# Patient Record
Sex: Female | Born: 1945 | Race: White | Hispanic: No | Marital: Married | Smoking: Never smoker
Health system: Southern US, Community
[De-identification: ages and names within clinical notes are randomized; demographics above are authoritative.]

## PROBLEM LIST (undated history)

## (undated) DIAGNOSIS — M1990 Unspecified osteoarthritis, unspecified site: Secondary | ICD-10-CM

## (undated) DIAGNOSIS — I10 Essential (primary) hypertension: Secondary | ICD-10-CM

## (undated) DIAGNOSIS — C801 Malignant (primary) neoplasm, unspecified: Secondary | ICD-10-CM

## (undated) DIAGNOSIS — E079 Disorder of thyroid, unspecified: Secondary | ICD-10-CM

## (undated) HISTORY — PX: TONSILLECTOMY: SUR1361

## (undated) HISTORY — PX: BREAST SURGERY: SHX581

## (undated) HISTORY — PX: THYROID SURGERY: SHX805

## (undated) HISTORY — PX: ABDOMINAL HYSTERECTOMY: SHX81

## (undated) HISTORY — PX: OTHER SURGICAL HISTORY: SHX169

## (undated) HISTORY — PX: CATARACT EXTRACTION, BILATERAL: SHX1313

---

## 2016-04-02 NOTE — Progress Notes (Signed)
Pt is being scheduled for preop appt; please place surgical orders in epic. Thanks.  

## 2016-04-05 ENCOUNTER — Ambulatory Visit: Payer: Self-pay | Admitting: Orthopedic Surgery

## 2016-05-06 NOTE — Patient Instructions (Addendum)
Nichole Pierce  05/06/2016   Your procedure is scheduled on: Wednesday May 13, 2016  Report to [facility]  Entrance take [place]  elevators to 3rd floor to  [place] at 6:00 AM.  Call this number if you have problems the morning of surgery [phone]   Remember: ONLY 1 PERSON MAY GO WITH YOU TO SHORT STAY TO GET  READY MORNING OF [name].  Do not eat food or drink liquids :After Midnight.     Take these medicines the morning of surgery with A SIP OF WATER: LEVOTHYROXINE (SYNTHROID)                               You may not have any metal on your body including hair pins and              piercings  Do not wear jewelry, make-up, lotions, powders or perfumes, deodorant             Do not wear nail polish.  Do not shave  48 hours prior to surgery.              Do not bring valuables to the hospital. [name]  Contacts, dentures or bridgework may not be worn into surgery.  Leave suitcase in the car. After surgery it may be brought to your room.                  Please read over the following fact sheets you were given:MRSA INFORMATION SHEET; INCENTIVE SPIROMETER; BLOOD TRANSFUSION INFORMATION SHEET  _____________________________________________________________________             [facility] - Preparing for Surgery Before surgery, you can play an important role.  Because skin is not sterile, your skin needs to be as free of germs as possible.  You can reduce the number of germs on your skin by washing with CHG (chlorahexidine gluconate) soap before surgery.  CHG is an antiseptic cleaner which kills germs and bonds with the skin to continue killing germs even after washing. Please DO NOT use if you have an allergy to CHG or antibacterial soaps.  If your skin becomes reddened/irritated stop using the CHG and inform your nurse when you arrive at Short Stay. Do not shave (including legs and  underarms) for at least 48 hours prior to the first CHG shower.  You may shave your face/neck. Please follow these instructions carefully:  1.  Shower with CHG Soap the night before surgery and the  morning of Surgery.  2.  If you choose to wash your hair, wash your hair first as usual with your  normal  shampoo.  3.  After you shampoo, rinse your hair and body thoroughly to remove the  shampoo.                           4.  Use CHG as you would any other liquid soap.  You can apply chg directly  to the skin and wash                       Gently with a scrungie or clean washcloth.  5.  Apply  the CHG Soap to your body ONLY FROM THE NECK DOWN.   Do not use on face/ open                           Wound or open sores. Avoid contact with eyes, ears mouth and genitals (private parts).                       Wash face,  Genitals (private parts) with your normal soap.             6.  Wash thoroughly, paying special attention to the area where your surgery  will be performed.  7.  Thoroughly rinse your body with warm water from the neck down.  8.  DO NOT shower/wash with your normal soap after using and rinsing off  the CHG Soap.                9.  Pat yourself dry with a clean towel.            10.  Wear clean pajamas.            11.  Place clean sheets on your bed the night of your first shower and do not  sleep with pets. Day of Surgery : Do not apply any lotions/deodorants the morning of surgery.  Please wear clean clothes to the hospital/surgery center.  FAILURE TO FOLLOW THESE INSTRUCTIONS MAY RESULT IN THE CANCELLATION OF YOUR SURGERY PATIENT SIGNATURE_________________________________  NURSE SIGNATURE__________________________________  ________________________________________________________________________   [name]  An incentive spirometer is a tool that can help keep your lungs clear and active. This tool measures how well you are filling your lungs with each breath. Taking  long deep breaths may help reverse or decrease the chance of developing breathing (pulmonary) problems (especially infection) following:  A long period of time when you are unable to move or be active. BEFORE THE PROCEDURE   If the spirometer includes an indicator to show your best effort, your nurse or respiratory therapist will set it to a desired goal.  If possible, sit up straight or lean slightly forward. Try not to slouch.  Hold the incentive spirometer in an upright position. INSTRUCTIONS FOR USE  1. Sit on the edge of your bed if possible, or sit up as far as you can in bed or on a chair. 2. Hold the incentive spirometer in an upright position. 3. Breathe out normally. 4. Place the mouthpiece in your mouth and seal your lips tightly around it. 5. Breathe in slowly and as deeply as possible, raising the piston or the ball toward the top of the column. 6. Hold your breath for 3-5 seconds or for as long as possible. Allow the piston or ball to fall to the bottom of the column. 7. Remove the mouthpiece from your mouth and breathe out normally. 8. Rest for a few seconds and repeat Steps 1 through 7 at least 10 times every 1-2 hours when you are awake. Take your time and take a few normal breaths between deep breaths. 9. The spirometer may include an indicator to show your best effort. Use the indicator as a goal to work toward during each repetition. 10. After each set of 10 deep breaths, practice coughing to be sure your lungs are clear. If you have an incision (the cut made at the time of surgery), support your incision when coughing by placing a pillow or rolled  up towels firmly against it. Once you are able to get out of bed, walk around indoors and cough well. You may stop using the incentive spirometer when instructed by your caregiver.  RISKS AND COMPLICATIONS  Take your time so you do not get dizzy or light-headed.  If you are in pain, you may need to take or ask for pain  medication before doing incentive spirometry. It is harder to take a deep breath if you are having pain. AFTER USE  Rest and breathe slowly and easily.  It can be helpful to keep track of a log of your progress. Your caregiver can provide you with a simple table to help with this. If you are using the spirometer at home, follow these instructions: [place] IF:   You are having difficultly using the spirometer.  You have trouble using the spirometer as often as instructed.  Your pain medication is not giving enough relief while using the spirometer.  You develop fever of 100.5 F (38.1 C) or higher. SEEK IMMEDIATE MEDICAL CARE IF:   You cough up bloody sputum that had not been present before.  You develop fever of 102 F (38.9 C) or greater.  You develop worsening pain at or near the incision site. MAKE SURE YOU:   Understand these instructions.  Will watch your condition.  Will get help right away if you are not doing well or get worse. Document Released: 10/26/2006 Document Revised: 09/07/2011 Document Reviewed: 12/27/2006 ExitCare Patient Information 2014 ExitCare, [place].   ________________________________________________________________________  WHAT IS A BLOOD TRANSFUSION? Blood Transfusion Information  A transfusion is the replacement of blood or some of its parts. Blood is made up of multiple cells which provide different functions.  Red blood cells carry oxygen and are used for blood loss replacement.  White blood cells fight against infection.  Platelets control bleeding.  Plasma helps clot blood.  Other blood products are available for specialized needs, such as hemophilia or other clotting disorders. BEFORE THE TRANSFUSION  Who gives blood for transfusions?   Healthy volunteers who are fully evaluated to make sure their blood is safe. This is blood bank blood. Transfusion therapy is the safest it has ever been in the practice of medicine.  Before blood is taken from a donor, a complete history is taken to make sure that person has no history of diseases nor engages in risky social behavior (examples are intravenous drug use or sexual activity with multiple partners). The donor's travel history is screened to minimize risk of transmitting infections, such as malaria. The donated blood is tested for signs of infectious diseases, such as HIV and hepatitis. The blood is then tested to be sure it is compatible with you in order to minimize the chance of a transfusion reaction. If you or a relative donates blood, this is often done in anticipation of surgery and is not appropriate for emergency situations. It takes many days to process the donated blood. RISKS AND COMPLICATIONS Although transfusion therapy is very safe and saves many lives, the main dangers of transfusion include:   Getting an infectious disease.  Developing a transfusion reaction. This is an allergic reaction to something in the blood you were given. Every precaution is taken to prevent this. The decision to have a blood transfusion has been considered carefully by your caregiver before blood is given. Blood is not given unless the benefits outweigh the risks. AFTER THE TRANSFUSION  Right after receiving a blood transfusion, you will usually feel  much better and more energetic. This is especially true if your red blood cells have gotten low (anemic). The transfusion raises the level of the red blood cells which carry oxygen, and this usually causes an energy increase.  The nurse administering the transfusion will monitor you carefully for complications. HOME CARE INSTRUCTIONS  No special instructions are needed after a transfusion. You may find your energy is better. Speak with your caregiver about any limitations on activity for underlying diseases you may have. SEEK MEDICAL CARE IF:   Your condition is not improving after your transfusion.  You develop redness or  irritation at the intravenous (IV) site. SEEK IMMEDIATE MEDICAL CARE IF:  Any of the following symptoms occur over the next 12 hours:  Shaking chills.  You have a temperature by mouth above 102 F (38.9 C), not controlled by medicine.  Chest, back, or muscle pain.  People around you feel you are not acting correctly or are confused.  Shortness of breath or difficulty breathing.  Dizziness and fainting.  You get a rash or develop hives.  You have a decrease in urine output.  Your urine turns a dark color or changes to pink, red, or brown. Any of the following symptoms occur over the next 10 days:  You have a temperature by mouth above 102 F (38.9 C), not controlled by medicine.  Shortness of breath.  Weakness after normal activity.  The white part of the eye turns yellow (jaundice).  You have a decrease in the amount of urine or are urinating less often.  Your urine turns a dark color or changes to pink, red, or brown. Document Released: 06/12/2000 Document Revised: 09/07/2011 Document Reviewed: 01/30/2008 [facility] Patient Information 2014 [place], [place].  _______________________________________________________________________

## 2016-05-07 ENCOUNTER — Encounter (HOSPITAL_COMMUNITY)
Admission: RE | Admit: 2016-05-07 | Discharge: 2016-05-07 | Disposition: A | Discharge status: Home / Self Care | Payer: Medicare Other | Source: Ambulatory Visit | Attending: Orthopedic Surgery | Admitting: Orthopedic Surgery

## 2016-05-07 ENCOUNTER — Encounter (HOSPITAL_COMMUNITY): Payer: Self-pay | Admitting: *Deleted

## 2016-05-07 DIAGNOSIS — Z01812 Encounter for preprocedural laboratory examination: Secondary | ICD-10-CM | POA: Diagnosis not present

## 2016-05-07 DIAGNOSIS — I10 Essential (primary) hypertension: Secondary | ICD-10-CM | POA: Diagnosis not present

## 2016-05-07 DIAGNOSIS — M1611 Unilateral primary osteoarthritis, right hip: Secondary | ICD-10-CM | POA: Insufficient documentation

## 2016-05-07 DIAGNOSIS — Z01810 Encounter for preprocedural cardiovascular examination: Secondary | ICD-10-CM | POA: Diagnosis present

## 2016-05-07 DIAGNOSIS — R9431 Abnormal electrocardiogram [ECG] [EKG]: Secondary | ICD-10-CM | POA: Insufficient documentation

## 2016-05-07 HISTORY — DX: Disorder of thyroid, unspecified: E07.9

## 2016-05-07 HISTORY — DX: Unspecified osteoarthritis, unspecified site: M19.90

## 2016-05-07 HISTORY — DX: Malignant (primary) neoplasm, unspecified: C80.1

## 2016-05-07 HISTORY — DX: Essential (primary) hypertension: I10

## 2016-05-07 LAB — CBC
HCT: 41 % (ref 36.0–46.0)
Hemoglobin: 13.5 g/dL (ref 12.0–15.0)
MCH: 29.5 pg (ref 26.0–34.0)
MCHC: 32.9 g/dL (ref 30.0–36.0)
MCV: 89.5 fL (ref 78.0–100.0)
PLATELETS: 229 10*3/uL (ref 150–400)
RBC: 4.58 MIL/uL (ref 3.87–5.11)
RDW: 14.2 % (ref 11.5–15.5)
WBC: 8.9 10*3/uL (ref 4.0–10.5)

## 2016-05-07 LAB — URINALYSIS, ROUTINE W REFLEX MICROSCOPIC
BILIRUBIN URINE: NEGATIVE
GLUCOSE, UA: NEGATIVE mg/dL
KETONES UR: NEGATIVE mg/dL
LEUKOCYTES UA: NEGATIVE
Nitrite: NEGATIVE
PROTEIN: NEGATIVE mg/dL
Specific Gravity, Urine: 1.015 (ref 1.005–1.030)
pH: 6 (ref 5.0–8.0)

## 2016-05-07 LAB — COMPREHENSIVE METABOLIC PANEL
ALT: 14 U/L (ref 14–54)
AST: 19 U/L (ref 15–41)
Albumin: 4.3 g/dL (ref 3.5–5.0)
Alkaline Phosphatase: 52 U/L (ref 38–126)
Anion gap: 7 (ref 5–15)
BUN: 25 mg/dL — ABNORMAL HIGH (ref 6–20)
CHLORIDE: 104 mmol/L (ref 101–111)
CO2: 28 mmol/L (ref 22–32)
CREATININE: 0.81 mg/dL (ref 0.44–1.00)
Calcium: 9.6 mg/dL (ref 8.9–10.3)
GFR calc non Af Amer: >60 mL/min (ref >60)
Glucose, Bld: 97 mg/dL (ref 65–99)
POTASSIUM: 3.7 mmol/L (ref 3.5–5.1)
SODIUM: 139 mmol/L (ref 135–145)
Total Bilirubin: 0.6 mg/dL (ref 0.3–1.2)
Total Protein: 7 g/dL (ref 6.5–8.1)

## 2016-05-07 LAB — SURGICAL PCR SCREEN
MRSA, PCR: NEGATIVE
Staphylococcus aureus: NEGATIVE

## 2016-05-07 LAB — ABO/RH: ABO/RH(D): O POS

## 2016-05-07 LAB — APTT: aPTT: 39 seconds — ABNORMAL HIGH (ref 24–36)

## 2016-05-07 LAB — URINE MICROSCOPIC-ADD ON

## 2016-05-07 LAB — PROTIME-INR
INR: 1
Prothrombin Time: 13.2 seconds (ref 11.4–15.2)

## 2016-05-07 NOTE — Progress Notes (Addendum)
CMP and PTT results in epic per PAT visit 04/06/2016 sent to Dr Wynelle Link

## 2016-05-08 NOTE — Progress Notes (Signed)
Clearance note per chart per Dr Drinda Butts 04/29/2016

## 2016-05-12 ENCOUNTER — Ambulatory Visit: Payer: Self-pay | Admitting: Orthopedic Surgery

## 2016-05-12 NOTE — H&P (Signed)
Nichole Pierce DOB: 1945-11-29 Married / Language: English / Race: White Female Date of Admission:  05/13/2016 CC:  Right Hip Pain History of Present Illness  The patient is a 70 year old female who comes in for a preoperative History and Physical. The patient is scheduled for a right total hip arthroplasty (anterior) to be performed by Dr. Dione Plover. Aluisio, MD at [facility] on 05/13/2016. The patient is a 70 year old female who presented for follow up of their hip. The patient is being followed for their bilateral hip pain and osteoarthritis. They are months out from intra-articular injections. Symptoms reported include: pain and aching. The patient feels that they are doing well and report their pain level to be mild. The following medication has been used for pain control: antiinflammatory medication. The patient has reported improvement of their symptoms with: Cortisone injections. Unfortunately, her hip is getting aggressively worse. She is losing more mobility. 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 She is even having pain at rest and pain at night. The hip is definitely limiting her ability to do things and is having a very negative effect on her life. She also has discomfort in her left hip, but left hip does not bother as much as the right, but it is painful. She is ready to go ahead and proceed with surgery on the right side. Her radiographs some earlier this year showed that she does have bone on bone arthritis throughout, the right worse than left hip with subchondral cystic formation, osteophyte formation. At this point, the most predictable means of improving pain and function is total hip arthroplasty. The procedure, risks, potential complications and rehab course are discussed in detail and the patient elects to proceed. She has got significant arthritis in both hips, but the right is more symptomatic. She is  having progressively worsening dysfunction also. Injection did not help. They have been treated conservatively in the past for the above stated problem and despite conservative measures, they continue to have progressive pain and severe functional limitations and dysfunction. They have failed non-operative management including home exercise, medications, and injections. It is felt that they would benefit from undergoing total joint replacement. Risks and benefits of the procedure have been discussed with the patient and they elect to proceed with surgery. There are no active contraindications to surgery such as ongoing infection or rapidly progressive neurological disease.  Problem List/Past Medical Primary osteoarthritis of right hip (M16.11)  Cancer  Uterine High blood pressure  Hypercholesterolemia  Hemorrhoids  Urinary Incontinence  Menopause  Post-surgical  Allergies Erythromycin *MACROLIDES*  Percocet *ANALGESICS - OPIOID*  Nausea, Vomiting.  Family History Cancer  father Heart Disease  grandmother mothers side Liver Disease, Chronic  grandfather mothers side Severe allergy  child  Social History Alcohol use  never consumed alcohol Current work status  working part time Drug/Alcohol Rehab (Currently)  no Drug/Alcohol Rehab (Previously)  no Exercise  Exercises daily; does other Illicit drug use  no Living situation  live with spouse Marital status  married Number of flights of stairs before winded  2-3 Pain Contract  no Tobacco / smoke exposure  no Tobacco use  never smoker  Medication History Calcium (500MG  Tablet, Oral) Active. Cholecalciferol (1000UNIT Capsule, Oral) Active. Aspirin (81MG  Tablet Chewable, 1 (one) Oral) Active. Turmeric Active. Co-Q-10 Active. Multivitaimin Active. Ibuprofen Active. Aleve Active. Prolia Injections Active. Estradiol (0.025MG /24HR Patch TW, Transdermal) Active. Losartan Potassium (  50MG  Tablet,  Oral) Active. Levothyroxine Sodium (75MCG Tablet, Oral) Active. Simvastatin (20MG  Tablet, Oral) Active. Krill Oil Active.   Past Surgical History Colon Polyp Removal - Colonoscopy  Hysterectomy  complete (cancerous) Thyroidectomy; Subtotal  right Tonsillectomy  ORIF Left Patella  Salivary Gland Surgery   Review of Systems General Not Present- Chills, Fatigue, Fever, Memory Loss, Night Sweats, Weight Gain and Weight Loss. Skin Not Present- Eczema, Hives, Itching, Lesions and Rash. HEENT Not Present- Dentures, Double Vision, Headache, Hearing Loss, Tinnitus and Visual Loss. Respiratory Not Present- Allergies, Chronic Cough, Coughing up blood, Shortness of breath at rest and Shortness of breath with exertion. Cardiovascular Not Present- Chest Pain, Difficulty Breathing Lying Down, Murmur, Palpitations, Racing/skipping heartbeats and Swelling. Gastrointestinal Not Present- Abdominal Pain, Bloody Stool, Constipation, Diarrhea, Difficulty Swallowing, Heartburn, Jaundice, Loss of appetitie, Nausea and Vomiting. Female Genitourinary Present- Incontinence (stress) and Urinary frequency. Not Present- Blood in Urine, Discharge, Flank Pain, Painful Urination, Urgency, Urinary Retention, Urinating at Night and Weak urinary stream. Musculoskeletal Present- Joint Pain and Joint Swelling. Not Present- Back Pain, Morning Stiffness, Muscle Pain, Muscle Weakness and Spasms. Neurological Not Present- Blackout spells, Difficulty with balance, Dizziness, Paralysis, Tremor and Weakness. Psychiatric Not Present- Insomnia.  Vitals Weight: 150 lb Height: 66in Body Surface Area: 1.77 m Body Mass Index: 24.21 kg/m  Pulse: 84 (Regular)  BP: 156/82 (Sitting, Right Arm, Standard)   Physical Exam  General Mental Status -Alert, cooperative and good historian. General Appearance-pleasant, Not in acute distress. Orientation-Oriented X3. Build & Nutrition-Well nourished and Well  developed.  Head and Neck Head-normocephalic, atraumatic . Neck Global Assessment - supple, no bruit auscultated on the right, no bruit auscultated on the left.  Eye Pupil - Bilateral-Regular and Round. Motion - Bilateral-EOMI.  Chest and Lung Exam Auscultation Breath sounds - clear at anterior chest wall and clear at posterior chest wall. Adventitious sounds - No Adventitious sounds.  Cardiovascular Auscultation Rhythm - Regular rate and rhythm. Heart Sounds - S1 WNL and S2 WNL. Murmurs & Other Heart Sounds: Murmur 1 - Location - Aortic Area. Timing - Early systolic. Grade - II/VI.  Abdomen Palpation/Percussion Tenderness - Abdomen is non-tender to palpation. Rigidity (guarding) - Abdomen is soft. Auscultation Auscultation of the abdomen reveals - Bowel sounds normal.  Female Genitourinary Note: Not done, not pertinent to present illness  Musculoskeletal Note: On exam, she is alert and oriented, in no apparent distress. Her right hip can be flexed to 100, minimal internal rotation to only about 10 degrees of external rotation, 10 to 20 degrees of abduction. Left hip flexion about 100, minimal internal rotation about 20 degrees abduction, 20 external rotation.  RADIOGRAPHS I reviewed her radiographs some earlier this year. She does have bone on bone arthritis throughout, the right worse than left hip with subchondral cystic formation, osteophyte formation.  Assessment & Plan Primary osteoarthritis of right hip (M16.11) Primary osteoarthritis of left hip (M16.12)  Note:Surgical Plans: Right Total Hip Replacement - Anterior Approach  Disposition: Home  PCP: [facility]  Topical TXA - Uterine Cancer  Anesthesia Issues: None  Signed electronically by Joelene Millin, III PA-C

## 2016-05-13 ENCOUNTER — Encounter (HOSPITAL_COMMUNITY): Payer: Self-pay | Admitting: *Deleted

## 2016-05-13 ENCOUNTER — Inpatient Hospital Stay (HOSPITAL_COMMUNITY): Payer: Medicare Other | Admitting: Certified Registered Nurse Anesthetist

## 2016-05-13 ENCOUNTER — Encounter (HOSPITAL_COMMUNITY)
Admission: RE | Disposition: A | Discharge status: Home Health | Payer: Self-pay | Source: Ambulatory Visit | Attending: Orthopedic Surgery

## 2016-05-13 ENCOUNTER — Inpatient Hospital Stay (HOSPITAL_COMMUNITY): Payer: Medicare Other

## 2016-05-13 ENCOUNTER — Inpatient Hospital Stay (HOSPITAL_COMMUNITY)
Admission: RE | Admit: 2016-05-13 | Discharge: 2016-05-14 | DRG: 470 | Disposition: A | Discharge status: Home Health | Payer: Medicare Other | Source: Ambulatory Visit | Attending: Orthopedic Surgery | Admitting: Orthopedic Surgery

## 2016-05-13 DIAGNOSIS — Z7982 Long term (current) use of aspirin: Secondary | ICD-10-CM | POA: Diagnosis not present

## 2016-05-13 DIAGNOSIS — Z79899 Other long term (current) drug therapy: Secondary | ICD-10-CM

## 2016-05-13 DIAGNOSIS — Z791 Long term (current) use of non-steroidal anti-inflammatories (NSAID): Secondary | ICD-10-CM | POA: Diagnosis not present

## 2016-05-13 DIAGNOSIS — E7800 Pure hypercholesterolemia, unspecified: Secondary | ICD-10-CM | POA: Diagnosis present

## 2016-05-13 DIAGNOSIS — Z96649 Presence of unspecified artificial hip joint: Secondary | ICD-10-CM

## 2016-05-13 DIAGNOSIS — M169 Osteoarthritis of hip, unspecified: Secondary | ICD-10-CM | POA: Diagnosis present

## 2016-05-13 DIAGNOSIS — M25751 Osteophyte, right hip: Secondary | ICD-10-CM | POA: Diagnosis present

## 2016-05-13 DIAGNOSIS — E039 Hypothyroidism, unspecified: Secondary | ICD-10-CM | POA: Diagnosis present

## 2016-05-13 DIAGNOSIS — Z881 Allergy status to other antibiotic agents status: Secondary | ICD-10-CM | POA: Diagnosis not present

## 2016-05-13 DIAGNOSIS — M1611 Unilateral primary osteoarthritis, right hip: Secondary | ICD-10-CM | POA: Diagnosis present

## 2016-05-13 DIAGNOSIS — R32 Unspecified urinary incontinence: Secondary | ICD-10-CM | POA: Diagnosis present

## 2016-05-13 DIAGNOSIS — M25551 Pain in right hip: Secondary | ICD-10-CM | POA: Diagnosis present

## 2016-05-13 DIAGNOSIS — I10 Essential (primary) hypertension: Secondary | ICD-10-CM | POA: Diagnosis present

## 2016-05-13 DIAGNOSIS — Z8542 Personal history of malignant neoplasm of other parts of uterus: Secondary | ICD-10-CM

## 2016-05-13 DIAGNOSIS — Z91018 Allergy to other foods: Secondary | ICD-10-CM

## 2016-05-13 HISTORY — PX: TOTAL HIP ARTHROPLASTY: SHX124

## 2016-05-13 LAB — TYPE AND SCREEN
ABO/RH(D): O POS
ANTIBODY SCREEN: NEGATIVE

## 2016-05-13 SURGERY — ARTHROPLASTY, HIP, TOTAL, ANTERIOR APPROACH
Anesthesia: Spinal | Site: Hip | Laterality: Right

## 2016-05-13 MED ORDER — DEXAMETHASONE SODIUM PHOSPHATE 10 MG/ML IJ SOLN
10.0000 mg | Freq: Once | INTRAMUSCULAR | Status: AC
  Administered 2016-05-13: 10 mg via INTRAVENOUS

## 2016-05-13 MED ORDER — MEPERIDINE HCL 50 MG/ML IJ SOLN: 6.2500 mg | INTRAMUSCULAR | Status: DC | PRN

## 2016-05-13 MED ORDER — ONDANSETRON HCL 4 MG/2ML IJ SOLN
4.0000 mg | Freq: Four times a day (QID) | INTRAMUSCULAR | Status: DC | PRN
  Administered 2016-05-13: 4 mg via INTRAVENOUS
  Filled 2016-05-13 (×2): qty 2

## 2016-05-13 MED ORDER — PHENYLEPHRINE HCL 10 MG/ML IJ SOLN
INTRAMUSCULAR | Status: AC
  Filled 2016-05-13: qty 1

## 2016-05-13 MED ORDER — ACETAMINOPHEN 500 MG PO TABS
1000.0000 mg | ORAL_TABLET | Freq: Four times a day (QID) | ORAL | Status: AC
  Administered 2016-05-13 – 2016-05-14 (×4): 1000 mg via ORAL
  Filled 2016-05-13 (×4): qty 2

## 2016-05-13 MED ORDER — PROPOFOL 10 MG/ML IV BOLUS
INTRAVENOUS | Status: AC
  Filled 2016-05-13: qty 40

## 2016-05-13 MED ORDER — MIDAZOLAM HCL 2 MG/2ML IJ SOLN: 0.5000 mg | Freq: Once | INTRAMUSCULAR | Status: DC | PRN

## 2016-05-13 MED ORDER — EPHEDRINE 5 MG/ML INJ
INTRAVENOUS | Status: AC
  Filled 2016-05-13: qty 10

## 2016-05-13 MED ORDER — PHENYLEPHRINE 40 MCG/ML (10ML) SYRINGE FOR IV PUSH (FOR BLOOD PRESSURE SUPPORT)
PREFILLED_SYRINGE | INTRAVENOUS | Status: AC
  Filled 2016-05-13: qty 10

## 2016-05-13 MED ORDER — MENTHOL 3 MG MT LOZG: 1.0000 | LOZENGE | OROMUCOSAL | Status: DC | PRN

## 2016-05-13 MED ORDER — CEFAZOLIN SODIUM-DEXTROSE 2-4 GM/100ML-% IV SOLN
2.0000 g | INTRAVENOUS | Status: AC
  Administered 2016-05-13: 2 g via INTRAVENOUS

## 2016-05-13 MED ORDER — ACETAMINOPHEN 10 MG/ML IV SOLN
INTRAVENOUS | Status: AC
  Filled 2016-05-13: qty 100

## 2016-05-13 MED ORDER — METHOCARBAMOL 1000 MG/10ML IJ SOLN
500.0000 mg | Freq: Four times a day (QID) | INTRAMUSCULAR | Status: DC | PRN
  Filled 2016-05-13: qty 5

## 2016-05-13 MED ORDER — ACETAMINOPHEN 10 MG/ML IV SOLN
1000.0000 mg | Freq: Once | INTRAVENOUS | Status: AC
  Administered 2016-05-13: 1000 mg via INTRAVENOUS

## 2016-05-13 MED ORDER — BUPIVACAINE HCL (PF) 0.25 % IJ SOLN
INTRAMUSCULAR | Status: AC
  Filled 2016-05-13: qty 30

## 2016-05-13 MED ORDER — MIDAZOLAM HCL 2 MG/2ML IJ SOLN
INTRAMUSCULAR | Status: AC
  Filled 2016-05-13: qty 2

## 2016-05-13 MED ORDER — EPHEDRINE SULFATE 50 MG/ML IJ SOLN
INTRAMUSCULAR | Status: DC | PRN
  Administered 2016-05-13: 10 mg via INTRAVENOUS

## 2016-05-13 MED ORDER — LEVOTHYROXINE SODIUM 75 MCG PO TABS
75.0000 ug | ORAL_TABLET | Freq: Every day | ORAL | Status: DC
  Administered 2016-05-14: 75 ug via ORAL
  Filled 2016-05-13: qty 1

## 2016-05-13 MED ORDER — DIPHENHYDRAMINE HCL 12.5 MG/5ML PO ELIX: 12.5000 mg | ORAL_SOLUTION | ORAL | Status: DC | PRN

## 2016-05-13 MED ORDER — MORPHINE SULFATE (PF) 2 MG/ML IV SOLN: 1.0000 mg | INTRAVENOUS | Status: DC | PRN

## 2016-05-13 MED ORDER — TRAMADOL HCL 50 MG PO TABS
50.0000 mg | ORAL_TABLET | Freq: Four times a day (QID) | ORAL | Status: DC | PRN
  Administered 2016-05-13 – 2016-05-14 (×2): 100 mg via ORAL
  Filled 2016-05-13 (×2): qty 2

## 2016-05-13 MED ORDER — SIMVASTATIN 20 MG PO TABS
20.0000 mg | ORAL_TABLET | Freq: Every evening | ORAL | Status: DC
  Administered 2016-05-13: 20 mg via ORAL
  Filled 2016-05-13: qty 1

## 2016-05-13 MED ORDER — CEFAZOLIN SODIUM-DEXTROSE 2-4 GM/100ML-% IV SOLN
INTRAVENOUS | Status: AC
  Filled 2016-05-13: qty 100

## 2016-05-13 MED ORDER — LACTATED RINGERS IV SOLN
INTRAVENOUS | Status: DC
  Administered 2016-05-13 (×2): via INTRAVENOUS

## 2016-05-13 MED ORDER — ACETAMINOPHEN 325 MG PO TABS: 650.0000 mg | ORAL_TABLET | Freq: Four times a day (QID) | ORAL | Status: DC | PRN

## 2016-05-13 MED ORDER — POLYETHYLENE GLYCOL 3350 17 G PO PACK: 17.0000 g | PACK | Freq: Every day | ORAL | Status: DC | PRN

## 2016-05-13 MED ORDER — PHENYLEPHRINE HCL 10 MG/ML IJ SOLN
INTRAMUSCULAR | Status: DC | PRN
  Administered 2016-05-13 (×2): 120 ug via INTRAVENOUS
  Administered 2016-05-13: 80 ug via INTRAVENOUS

## 2016-05-13 MED ORDER — PROMETHAZINE HCL 25 MG/ML IJ SOLN: 6.2500 mg | INTRAMUSCULAR | Status: DC | PRN

## 2016-05-13 MED ORDER — METHOCARBAMOL 500 MG PO TABS
500.0000 mg | ORAL_TABLET | Freq: Four times a day (QID) | ORAL | Status: DC | PRN
  Administered 2016-05-13 (×2): 500 mg via ORAL
  Filled 2016-05-13 (×2): qty 1

## 2016-05-13 MED ORDER — HYDROMORPHONE HCL 1 MG/ML IJ SOLN: 0.2500 mg | INTRAMUSCULAR | Status: DC | PRN

## 2016-05-13 MED ORDER — BUPIVACAINE HCL (PF) 0.25 % IJ SOLN
INTRAMUSCULAR | Status: DC | PRN
  Administered 2016-05-13: 30 mL

## 2016-05-13 MED ORDER — OXYCODONE HCL 5 MG PO TABS
5.0000 mg | ORAL_TABLET | ORAL | Status: DC | PRN
Start: 2016-05-13 — End: 2016-05-14
  Administered 2016-05-13 (×3): 5 mg via ORAL
  Administered 2016-05-13: 10 mg via ORAL
  Administered 2016-05-13 – 2016-05-14 (×2): 5 mg via ORAL
  Filled 2016-05-13 (×4): qty 1
  Filled 2016-05-13: qty 2
  Filled 2016-05-13: qty 1

## 2016-05-13 MED ORDER — ONDANSETRON HCL 4 MG/2ML IJ SOLN
INTRAMUSCULAR | Status: AC
Start: 2016-05-13 — End: 2016-05-13
  Filled 2016-05-13: qty 2

## 2016-05-13 MED ORDER — DEXAMETHASONE SODIUM PHOSPHATE 10 MG/ML IJ SOLN
10.0000 mg | Freq: Once | INTRAMUSCULAR | Status: AC
  Administered 2016-05-14: 10 mg via INTRAVENOUS
  Filled 2016-05-13: qty 1

## 2016-05-13 MED ORDER — CEFAZOLIN SODIUM-DEXTROSE 2-4 GM/100ML-% IV SOLN
2.0000 g | Freq: Four times a day (QID) | INTRAVENOUS | Status: AC
  Administered 2016-05-13 (×2): 2 g via INTRAVENOUS
  Filled 2016-05-13 (×2): qty 100

## 2016-05-13 MED ORDER — DEXTROSE 5 % IV SOLN
INTRAVENOUS | Status: DC | PRN
  Administered 2016-05-13: 50 ug/min via INTRAVENOUS

## 2016-05-13 MED ORDER — FENTANYL CITRATE (PF) 100 MCG/2ML IJ SOLN
INTRAMUSCULAR | Status: DC | PRN
  Administered 2016-05-13 (×2): 50 ug via INTRAVENOUS

## 2016-05-13 MED ORDER — METOCLOPRAMIDE HCL 5 MG PO TABS: 5.0000 mg | ORAL_TABLET | Freq: Three times a day (TID) | ORAL | Status: DC | PRN

## 2016-05-13 MED ORDER — DOCUSATE SODIUM 100 MG PO CAPS
100.0000 mg | ORAL_CAPSULE | Freq: Two times a day (BID) | ORAL | Status: DC
  Administered 2016-05-13 – 2016-05-14 (×2): 100 mg via ORAL
  Filled 2016-05-13 (×2): qty 1

## 2016-05-13 MED ORDER — MIDAZOLAM HCL 5 MG/5ML IJ SOLN
INTRAMUSCULAR | Status: DC | PRN
  Administered 2016-05-13 (×2): 1 mg via INTRAVENOUS

## 2016-05-13 MED ORDER — ACETAMINOPHEN 650 MG RE SUPP: 650.0000 mg | Freq: Four times a day (QID) | RECTAL | Status: DC | PRN

## 2016-05-13 MED ORDER — DEXAMETHASONE SODIUM PHOSPHATE 10 MG/ML IJ SOLN
INTRAMUSCULAR | Status: AC
  Filled 2016-05-13: qty 1

## 2016-05-13 MED ORDER — BUPIVACAINE IN DEXTROSE 0.75-8.25 % IT SOLN
INTRATHECAL | Status: DC | PRN
  Administered 2016-05-13: 1.8 mL via INTRATHECAL

## 2016-05-13 MED ORDER — TRANEXAMIC ACID 1000 MG/10ML IV SOLN
INTRAVENOUS | Status: DC | PRN
  Administered 2016-05-13: 2000 mg via TOPICAL

## 2016-05-13 MED ORDER — FLEET ENEMA 7-19 GM/118ML RE ENEM: 1.0000 | ENEMA | Freq: Once | RECTAL | Status: DC | PRN

## 2016-05-13 MED ORDER — TRANEXAMIC ACID 1000 MG/10ML IV SOLN
2000.0000 mg | Freq: Once | INTRAVENOUS | Status: DC
  Filled 2016-05-13 (×2): qty 20

## 2016-05-13 MED ORDER — LACTATED RINGERS IV SOLN
INTRAVENOUS | Status: DC
Start: 2016-05-13 — End: 2016-05-14

## 2016-05-13 MED ORDER — LOSARTAN POTASSIUM 25 MG PO TABS
12.5000 mg | ORAL_TABLET | Freq: Every day | ORAL | Status: DC
Start: 2016-05-14 — End: 2016-05-14
  Administered 2016-05-14: 12.5 mg via ORAL
  Filled 2016-05-13: qty 1

## 2016-05-13 MED ORDER — PROPOFOL 500 MG/50ML IV EMUL
INTRAVENOUS | Status: DC | PRN
  Administered 2016-05-13: 25 ug/kg/min via INTRAVENOUS

## 2016-05-13 MED ORDER — SODIUM CHLORIDE 0.9 % IV SOLN
INTRAVENOUS | Status: DC
  Administered 2016-05-13 – 2016-05-14 (×2): via INTRAVENOUS

## 2016-05-13 MED ORDER — FENTANYL CITRATE (PF) 100 MCG/2ML IJ SOLN
INTRAMUSCULAR | Status: AC
  Filled 2016-05-13: qty 2

## 2016-05-13 MED ORDER — PROPOFOL 10 MG/ML IV BOLUS
INTRAVENOUS | Status: AC
  Filled 2016-05-13: qty 20

## 2016-05-13 MED ORDER — BISACODYL 10 MG RE SUPP: 10.0000 mg | Freq: Every day | RECTAL | Status: DC | PRN

## 2016-05-13 MED ORDER — ONDANSETRON HCL 4 MG PO TABS: 4.0000 mg | ORAL_TABLET | Freq: Four times a day (QID) | ORAL | Status: DC | PRN

## 2016-05-13 MED ORDER — RIVAROXABAN 10 MG PO TABS
10.0000 mg | ORAL_TABLET | Freq: Every day | ORAL | Status: DC
  Administered 2016-05-14: 10 mg via ORAL
  Filled 2016-05-13: qty 1

## 2016-05-13 MED ORDER — PHENOL 1.4 % MT LIQD: 1.0000 | OROMUCOSAL | Status: DC | PRN

## 2016-05-13 MED ORDER — ONDANSETRON HCL 4 MG/2ML IJ SOLN
INTRAMUSCULAR | Status: DC | PRN
  Administered 2016-05-13: 4 mg via INTRAVENOUS

## 2016-05-13 MED ORDER — METOCLOPRAMIDE HCL 5 MG/ML IJ SOLN
5.0000 mg | Freq: Three times a day (TID) | INTRAMUSCULAR | Status: DC | PRN
Start: 2016-05-13 — End: 2016-05-14
  Administered 2016-05-13: 10 mg via INTRAVENOUS
  Filled 2016-05-13: qty 2

## 2016-05-13 SURGICAL SUPPLY — 36 items
BAG DECANTER FOR FLEXI CONT (MISCELLANEOUS) ×3 IMPLANT
BAG ZIPLOCK 12X15 (MISCELLANEOUS) IMPLANT
BLADE SAG 18X100X1.27 (BLADE) ×3 IMPLANT
CAPT HIP TOTAL 2 ×3 IMPLANT
CLOSURE WOUND 1/2 X4 (GAUZE/BANDAGES/DRESSINGS) ×1
CLOTH BEACON ORANGE TIMEOUT ST (SAFETY) ×3 IMPLANT
COVER PERINEAL POST (MISCELLANEOUS) ×3 IMPLANT
DECANTER SPIKE VIAL GLASS SM (MISCELLANEOUS) ×3 IMPLANT
DRAPE STERI IOBAN 125X83 (DRAPES) ×3 IMPLANT
DRAPE U-SHAPE 47X51 STRL (DRAPES) ×6 IMPLANT
DRSG ADAPTIC 3X8 NADH LF (GAUZE/BANDAGES/DRESSINGS) ×3 IMPLANT
DRSG MEPILEX BORDER 4X4 (GAUZE/BANDAGES/DRESSINGS) ×3 IMPLANT
DRSG MEPILEX BORDER 4X8 (GAUZE/BANDAGES/DRESSINGS) ×3 IMPLANT
DURAPREP 26ML APPLICATOR (WOUND CARE) ×3 IMPLANT
ELECT REM PT RETURN 9FT ADLT (ELECTROSURGICAL) ×3
ELECTRODE REM PT RTRN 9FT ADLT (ELECTROSURGICAL) ×1 IMPLANT
EVACUATOR 1/8 PVC DRAIN (DRAIN) ×3 IMPLANT
GLOVE BIO SURGEON STRL SZ7.5 (GLOVE) ×3 IMPLANT
GLOVE BIO SURGEON STRL SZ8 (GLOVE) ×6 IMPLANT
GLOVE BIOGEL PI IND STRL 8 (GLOVE) ×2 IMPLANT
GLOVE BIOGEL PI INDICATOR 8 (GLOVE) ×4
GOWN STRL REUS W/TWL LRG LVL3 (GOWN DISPOSABLE) ×3 IMPLANT
GOWN STRL REUS W/TWL XL LVL3 (GOWN DISPOSABLE) ×3 IMPLANT
NS IRRIG 1000ML POUR BTL (IV SOLUTION) ×3 IMPLANT
PACK ANTERIOR HIP CUSTOM (KITS) ×3 IMPLANT
STRIP CLOSURE SKIN 1/2X4 (GAUZE/BANDAGES/DRESSINGS) ×2 IMPLANT
SUT ETHIBOND NAB CT1 #1 30IN (SUTURE) ×3 IMPLANT
SUT MNCRL AB 4-0 PS2 18 (SUTURE) ×3 IMPLANT
SUT VIC AB 2-0 CT1 27 (SUTURE) ×4
SUT VIC AB 2-0 CT1 TAPERPNT 27 (SUTURE) ×2 IMPLANT
SUT VLOC 180 0 24IN GS25 (SUTURE) ×3 IMPLANT
SYR 50ML LL SCALE MARK (SYRINGE) IMPLANT
TRAY FOLEY W/METER SILVER 14FR (SET/KITS/TRAYS/PACK) ×3 IMPLANT
TRAY FOLEY W/METER SILVER 16FR (SET/KITS/TRAYS/PACK) IMPLANT
WATER STERILE IRR 1000ML POUR (IV SOLUTION) ×6 IMPLANT
YANKAUER SUCT BULB TIP 10FT TU (MISCELLANEOUS) ×3 IMPLANT

## 2016-05-13 NOTE — Interval H&P Note (Signed)
History and Physical Interval Note:  05/13/2016 8:23 AM  Nichole Pierce  has presented today for surgery, with the diagnosis of RIGHT HIP OA   The various methods of treatment have been discussed with the patient and family. After consideration of risks, benefits and other options for treatment, the patient has consented to  Procedure(s): RIGHT TOTAL HIP ARTHROPLASTY ANTERIOR APPROACH (Right) as a surgical intervention .  The patient's history has been reviewed, patient examined, no change in status, stable for surgery.  I have reviewed the patient's chart and labs.  Questions were answered to the patient's satisfaction.     Gearlean Alf

## 2016-05-13 NOTE — Anesthesia Postprocedure Evaluation (Signed)
Anesthesia Post Note  Patient: Nollie Lujan  Procedure(s) Performed: Procedure(s) (LRB): RIGHT TOTAL HIP ARTHROPLASTY ANTERIOR APPROACH (Right)  Patient location during evaluation: PACU Anesthesia Type: Spinal Level of consciousness: awake and alert, oriented and patient cooperative Pain management: pain level controlled Vital Signs Assessment: post-procedure vital signs reviewed and stable Respiratory status: spontaneous breathing, nonlabored ventilation, respiratory function stable and patient connected to face mask oxygen Cardiovascular status: blood pressure returned to baseline and stable Postop Assessment: no signs of nausea or vomiting and spinal receding Anesthetic complications: no    Last Vitals:  Vitals:   05/13/16 1328 05/13/16 1425  BP: (!) 149/79 139/80  Pulse: 90 86  Resp: 18 15  Temp: 36.5 C 36.6 C    Last Pain:  Vitals:   05/13/16 1425  TempSrc: Oral  PainSc: 0-No pain                 Luz Burcher,E. Kloey Cazarez

## 2016-05-13 NOTE — Op Note (Signed)
OPERATIVE REPORT- TOTAL HIP ARTHROPLASTY   PREOPERATIVE DIAGNOSIS: Osteoarthritis of the Right hip.   POSTOPERATIVE DIAGNOSIS: Osteoarthritis of the Right  hip.   PROCEDURE: Right total hip arthroplasty, anterior approach.   SURGEON: Gaynelle Arabian, MD   ASSISTANT: Arlee Muslim, PA-C  ANESTHESIA:  Spinal  ESTIMATED BLOOD LOSS:-425 ml    DRAINS: Hemovac x1.   COMPLICATIONS: None   CONDITION: PACU - hemodynamically stable.   BRIEF CLINICAL NOTE: Nichole Pierce is a 70 y.o. female who has advanced end-  stage arthritis of their Right  hip with progressively worsening pain and  dysfunction.The patient has failed nonoperative management and presents for  total hip arthroplasty.   PROCEDURE IN DETAIL: After successful administration of spinal  anesthetic, the traction boots for the [facility] bed were placed on both  feet and the patient was placed onto the [facility] bed, boots placed into the leg  holders. The Right hip was then isolated from the perineum with plastic  drapes and prepped and draped in the usual sterile fashion. ASIS and  greater trochanter were marked and a oblique incision was made, starting  at about 1 cm lateral and 2 cm distal to the ASIS and coursing towards  the anterior cortex of the femur. The skin was cut with a 10 blade  through subcutaneous tissue to the level of the fascia overlying the  tensor fascia lata muscle. The fascia was then incised in line with the  incision at the junction of the anterior third and posterior 2/3rd. The  muscle was teased off the fascia and then the interval between the TFL  and the rectus was developed. The Hohmann retractor was then placed at  the top of the femoral neck over the capsule. The vessels overlying the  capsule were cauterized and the fat on top of the capsule was removed.  A Hohmann retractor was then placed anterior underneath the rectus  femoris to give exposure to the entire anterior capsule. A T-shaped   capsulotomy was performed. The edges were tagged and the femoral head  was identified.       Osteophytes are removed off the superior acetabulum.  The femoral neck was then cut in situ with an oscillating saw. Traction  was then applied to the left lower extremity utilizing the [facility]  traction. The femoral head was then removed. Retractors were placed  around the acetabulum and then circumferential removal of the labrum was  performed. Osteophytes were also removed. Reaming starts at 45 mm to  medialize and  Increased in 2 mm increments to 49 mm. We reamed in  approximately 40 degrees of abduction, 20 degrees anteversion. A 50 mm  pinnacle acetabular shell was then impacted in anatomic position under  fluoroscopic guidance with excellent purchase. We did not need to place  any additional dome screws. A 32 mm neutral + 4 marathon liner was then  placed into the acetabular shell.       The femoral lift was then placed along the lateral aspect of the femur  just distal to the vastus ridge. The leg was  externally rotated and capsule  was stripped off the inferior aspect of the femoral neck down to the  level of the lesser trochanter, this was done with electrocautery. The femur was lifted after this was performed. The  leg was then placed in an extended and adducted position essentially delivering the femur. We also removed the capsule superiorly and the piriformis from the piriformis fossa  to gain excellent exposure of the  proximal femur. Rongeur was used to remove some cancellous bone to get  into the lateral portion of the proximal femur for placement of the  initial starter reamer. The starter broaches was placed  the starter broach  and was shown to go down the center of the canal. Broaching  with the  Corail system was then performed starting at size 8, coursing  Up to size 14. A size 14 had excellent torsional and rotational  and axial stability. The trial high offset neck was then  placed  with a 32 + 1 trial head. The hip was then reduced. We confirmed that  the stem was in the canal both on AP and lateral x-rays. It also has excellent sizing. The hip was reduced with outstanding stability through full extension and full external rotation.. AP pelvis was taken and the leg lengths were measured and found to be equal. Hip was then dislocated again and the femoral head and neck removed. The  femoral broach was removed. Size 14 Corail stem with a high offset  neck was then impacted into the femur following native anteversion. Has  excellent purchase in the canal. Excellent torsional and rotational and  axial stability. It is confirmed to be in the canal on AP and lateral  fluoroscopic views. The 32 + 1 ceramic head was placed and the hip  reduced with outstanding stability. Again AP pelvis was taken and it  confirmed that the leg lengths were equal. The wound was then copiously  irrigated with saline solution and the capsule reattached and repaired  with Ethibond suture. 30 ml of .25% Bupivicaine was  injected into the capsule and into the edge of the tensor fascia lata as well as subcutaneous tissue. The fascia overlying the tensor fascia lata was then closed with a running #1 V-Loc. Subcu was closed with interrupted 2-0 Vicryl and subcuticular running 4-0 Monocryl. Incision was cleaned  and dried. Steri-Strips and a bulky sterile dressing applied. Hemovac  drain was hooked to suction and then the patient was awakened and transported to  recovery in stable condition.        Please note that a surgical assistant was a medical necessity for this procedure to perform it in a safe and expeditious manner. Assistant was necessary to provide appropriate retraction of vital neurovascular structures and to prevent femoral fracture and allow for anatomic placement of the prosthesis.  Gaynelle Arabian, M.D.

## 2016-05-13 NOTE — Anesthesia Procedure Notes (Deleted)
Spinal

## 2016-05-13 NOTE — Progress Notes (Signed)
Late note:  All care and documentaion done by Georgiann Mccoy, while supervised by Eddie North RN.

## 2016-05-13 NOTE — Anesthesia Procedure Notes (Signed)
Procedure Name: MAC Date/Time: 05/13/2016 8:30 AM Performed by: West Pugh Pre-anesthesia Checklist: Patient identified, Timeout performed, Emergency Drugs available, Suction available and Patient being monitored Patient Re-evaluated:Patient Re-evaluated prior to inductionOxygen Delivery Method: Simple face mask Placement Confirmation: positive ETCO2 Dental Injury: Teeth and Oropharynx as per pre-operative assessment

## 2016-05-13 NOTE — Transfer of Care (Signed)
Immediate Anesthesia Transfer of Care Note  Patient: Nichole Pierce  Procedure(s) Performed: Procedure(s): RIGHT TOTAL HIP ARTHROPLASTY ANTERIOR APPROACH (Right)  Patient Location: PACU  Anesthesia Type:Spinal  Level of Consciousness: sedated  Airway & Oxygen Therapy: Patient Spontanous Breathing and Patient connected to face mask oxygen  Post-op Assessment: Report given to RN and Post -op Vital signs reviewed and stable  Post vital signs: Reviewed and stable  Last Vitals:  Vitals:   05/13/16 0659  BP: (!) 168/83  Pulse: 74  Resp: 16  Temp: 36.6 C    Last Pain:  Vitals:   05/13/16 0659  TempSrc: Oral         Complications: No apparent anesthesia complications

## 2016-05-13 NOTE — H&P (View-Only) (Signed)
Nichole Pierce DOB: 1945-11-28 Married / Language: English / Race: White Female Date of Admission:  05/13/2016 CC:  Right Hip Pain History of Present Illness  The patient is a 70 year old female who comes in for a preoperative History and Physical. The patient is scheduled for a right total hip arthroplasty (anterior) to be performed by Dr. Dione Plover. Aluisio, MD at [facility] on 05/13/2016. The patient is a 70 year old female who presented for follow up of their hip. The patient is being followed for their bilateral hip pain and osteoarthritis. They are months out from intra-articular injections. Symptoms reported include: pain and aching. The patient feels that they are doing well and report their pain level to be mild. The following medication has been used for pain control: antiinflammatory medication. The patient has reported improvement of their symptoms with: Cortisone injections. Unfortunately, her hip is getting aggressively worse. She is losing more mobility. 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 She is even having pain at rest and pain at night. The hip is definitely limiting her ability to do things and is having a very negative effect on her life. She also has discomfort in her left hip, but left hip does not bother as much as the right, but it is painful. She is ready to go ahead and proceed with surgery on the right side. Her radiographs some earlier this year showed that she does have bone on bone arthritis throughout, the right worse than left hip with subchondral cystic formation, osteophyte formation. At this point, the most predictable means of improving pain and function is total hip arthroplasty. The procedure, risks, potential complications and rehab course are discussed in detail and the patient elects to proceed. She has got significant arthritis in both hips, but the right is more symptomatic. She is  having progressively worsening dysfunction also. Injection did not help. They have been treated conservatively in the past for the above stated problem and despite conservative measures, they continue to have progressive pain and severe functional limitations and dysfunction. They have failed non-operative management including home exercise, medications, and injections. It is felt that they would benefit from undergoing total joint replacement. Risks and benefits of the procedure have been discussed with the patient and they elect to proceed with surgery. There are no active contraindications to surgery such as ongoing infection or rapidly progressive neurological disease.  Problem List/Past Medical Primary osteoarthritis of right hip (M16.11)  Cancer  Uterine High blood pressure  Hypercholesterolemia  Hemorrhoids  Urinary Incontinence  Menopause  Post-surgical  Allergies Erythromycin *MACROLIDES*  Percocet *ANALGESICS - OPIOID*  Nausea, Vomiting.  Family History Cancer  father Heart Disease  grandmother mothers side Liver Disease, Chronic  grandfather mothers side Severe allergy  child  Social History Alcohol use  never consumed alcohol Current work status  working part time Drug/Alcohol Rehab (Currently)  no Drug/Alcohol Rehab (Previously)  no Exercise  Exercises daily; does other Illicit drug use  no Living situation  live with spouse Marital status  married Number of flights of stairs before winded  2-3 Pain Contract  no Tobacco / smoke exposure  no Tobacco use  never smoker  Medication History Calcium (500MG  Tablet, Oral) Active. Cholecalciferol (1000UNIT Capsule, Oral) Active. Aspirin (81MG  Tablet Chewable, 1 (one) Oral) Active. Turmeric Active. Co-Q-10 Active. Multivitaimin Active. Ibuprofen Active. Aleve Active. Prolia Injections Active. Estradiol (0.025MG /24HR Patch TW, Transdermal) Active. Losartan Potassium (  50MG  Tablet,  Oral) Active. Levothyroxine Sodium (75MCG Tablet, Oral) Active. Simvastatin (20MG  Tablet, Oral) Active. Krill Oil Active.   Past Surgical History Colon Polyp Removal - Colonoscopy  Hysterectomy  complete (cancerous) Thyroidectomy; Subtotal  right Tonsillectomy  ORIF Left Patella  Salivary Gland Surgery   Review of Systems General Not Present- Chills, Fatigue, Fever, Memory Loss, Night Sweats, Weight Gain and Weight Loss. Skin Not Present- Eczema, Hives, Itching, Lesions and Rash. HEENT Not Present- Dentures, Double Vision, Headache, Hearing Loss, Tinnitus and Visual Loss. Respiratory Not Present- Allergies, Chronic Cough, Coughing up blood, Shortness of breath at rest and Shortness of breath with exertion. Cardiovascular Not Present- Chest Pain, Difficulty Breathing Lying Down, Murmur, Palpitations, Racing/skipping heartbeats and Swelling. Gastrointestinal Not Present- Abdominal Pain, Bloody Stool, Constipation, Diarrhea, Difficulty Swallowing, Heartburn, Jaundice, Loss of appetitie, Nausea and Vomiting. Female Genitourinary Present- Incontinence (stress) and Urinary frequency. Not Present- Blood in Urine, Discharge, Flank Pain, Painful Urination, Urgency, Urinary Retention, Urinating at Night and Weak urinary stream. Musculoskeletal Present- Joint Pain and Joint Swelling. Not Present- Back Pain, Morning Stiffness, Muscle Pain, Muscle Weakness and Spasms. Neurological Not Present- Blackout spells, Difficulty with balance, Dizziness, Paralysis, Tremor and Weakness. Psychiatric Not Present- Insomnia.  Vitals Weight: 150 lb Height: 66in Body Surface Area: 1.77 m Body Mass Index: 24.21 kg/m  Pulse: 84 (Regular)  BP: 156/82 (Sitting, Right Arm, Standard)   Physical Exam  General Mental Status -Alert, cooperative and good historian. General Appearance-pleasant, Not in acute distress. Orientation-Oriented X3. Build & Nutrition-Well nourished and Well  developed.  Head and Neck Head-normocephalic, atraumatic . Neck Global Assessment - supple, no bruit auscultated on the right, no bruit auscultated on the left.  Eye Pupil - Bilateral-Regular and Round. Motion - Bilateral-EOMI.  Chest and Lung Exam Auscultation Breath sounds - clear at anterior chest wall and clear at posterior chest wall. Adventitious sounds - No Adventitious sounds.  Cardiovascular Auscultation Rhythm - Regular rate and rhythm. Heart Sounds - S1 WNL and S2 WNL. Murmurs & Other Heart Sounds: Murmur 1 - Location - Aortic Area. Timing - Early systolic. Grade - II/VI.  Abdomen Palpation/Percussion Tenderness - Abdomen is non-tender to palpation. Rigidity (guarding) - Abdomen is soft. Auscultation Auscultation of the abdomen reveals - Bowel sounds normal.  Female Genitourinary Note: Not done, not pertinent to present illness  Musculoskeletal Note: On exam, she is alert and oriented, in no apparent distress. Her right hip can be flexed to 100, minimal internal rotation to only about 10 degrees of external rotation, 10 to 20 degrees of abduction. Left hip flexion about 100, minimal internal rotation about 20 degrees abduction, 20 external rotation.  RADIOGRAPHS I reviewed her radiographs some earlier this year. She does have bone on bone arthritis throughout, the right worse than left hip with subchondral cystic formation, osteophyte formation.  Assessment & Plan Primary osteoarthritis of right hip (M16.11) Primary osteoarthritis of left hip (M16.12)  Note:Surgical Plans: Right Total Hip Replacement - Anterior Approach  Disposition: Home  PCP: [facility]  Topical TXA - Uterine Cancer  Anesthesia Issues: None  Signed electronically by Joelene Millin, III PA-C

## 2016-05-13 NOTE — Anesthesia Preprocedure Evaluation (Addendum)
Anesthesia Evaluation  Patient identified by MRN, date of birth, ID band Patient awake    Reviewed: Allergy & Precautions, NPO status , Patient's Chart, lab work & pertinent test results  History of Anesthesia Complications Negative for: history of anesthetic complications  Airway Mallampati: II  TM Distance: >3 FB Neck ROM: Full    Dental  (+) Dental Advisory Given   Pulmonary neg pulmonary ROS,    breath sounds clear to auscultation       Cardiovascular hypertension, Pt. on medications (-) angina Rhythm:Regular Rate:Normal     Neuro/Psych negative neurological ROS     GI/Hepatic negative GI ROS,   Endo/Other  Hypothyroidism   Renal/GU negative Renal ROS     Musculoskeletal  (+) Arthritis , Osteoarthritis,    Abdominal   Peds  Hematology negative hematology ROS (+)   Anesthesia Other Findings   Reproductive/Obstetrics                            Anesthesia Physical Anesthesia Plan  ASA: II  Anesthesia Plan: Spinal   Post-op Pain Management:    Induction:   Airway Management Planned: Natural Airway and Simple Face Mask  Additional Equipment:   Intra-op Plan:   Post-operative Plan:   Informed Consent: I have reviewed the patients History and Physical, chart, labs and discussed the procedure including the risks, benefits and alternatives for the proposed anesthesia with the patient or authorized representative who has indicated his/her understanding and acceptance.   Dental advisory given  Plan Discussed with: CRNA and Surgeon  Anesthesia Plan Comments: (Plan routine monitors, SAB)        Anesthesia Quick Evaluation

## 2016-05-13 NOTE — Evaluation (Signed)
Physical Therapy Evaluation Patient Details Name: Nichole Pierce MRN: [id number] DOB: [birth year] Today's Date: 05/13/2016   History of Present Illness  Pt s/p R THR  Clinical Impression  Pt s/p R THR presents with decreased R LE strength/ROM and post op pain limiting functional mobility.  Pt should progress to dc home with family assist.     Follow Up Recommendations Home health PT    Equipment Recommendations  None recommended by PT    Recommendations for Other Services OT consult     Precautions / Restrictions Precautions Precautions: Fall Restrictions Weight Bearing Restrictions: No Other Position/Activity Restrictions: WBAT      Mobility  Bed Mobility Overal bed mobility: Needs Assistance Bed Mobility: Supine to Sit     Supine to sit: Min assist     General bed mobility comments: cues for sequence and use of L LE to self assist  Transfers Overall transfer level: Needs assistance Equipment used: Rolling walker (2 wheeled) Transfers: Sit to/from Stand Sit to Stand: Min assist         General transfer comment: cues for LE management and use of UEs to self assist  Ambulation/Gait Ambulation/Gait assistance: Min assist Ambulation Distance (Feet): 60 Feet Assistive device: Rolling walker (2 wheeled) Gait Pattern/deviations: Step-to pattern;Step-through pattern;Decreased step length - right;Decreased step length - left;Shuffle;Trunk flexed Gait velocity: decr Gait velocity interpretation: Below normal speed for age/gender General Gait Details: cues for posture, position from RW and initial sequence  Stairs            Wheelchair Mobility    Modified Rankin (Stroke Patients Only)       Balance                                             Pertinent Vitals/Pain Pain Assessment: 0-10 Pain Score: 3  Pain Location: R hip Pain Descriptors / Indicators: Aching;Sore Pain Intervention(s): Limited activity within patient's  tolerance;Monitored during session;Premedicated before session;Ice applied    Home Living Family/patient expects to be discharged to:: Private residence Living Arrangements: Spouse/significant other Available Help at Discharge: Family Type of Home: House Home Access: Stairs to enter Entrance Stairs-Rails: Psychiatric nurse of Steps: 2 Home Layout: One level Home Equipment: Environmental consultant - 2 wheels;Walker - standard;Bedside commode      Prior Function Level of Independence: Independent               Hand Dominance        Extremity/Trunk Assessment   Upper Extremity Assessment: Overall WFL for tasks assessed           Lower Extremity Assessment: RLE deficits/detail      Cervical / Trunk Assessment: Normal  Communication   Communication: No difficulties  Cognition Arousal/Alertness: Awake/alert Behavior During Therapy: WFL for tasks assessed/performed Overall Cognitive Status: Within Functional Limits for tasks assessed                      General Comments      Exercises Total Joint Exercises Ankle Circles/Pumps: AROM;Both;15 reps;Supine   Assessment/Plan    PT Assessment Patient needs continued PT services  PT Problem List Decreased strength;Decreased range of motion;Decreased activity tolerance;Decreased mobility;Decreased knowledge of use of DME;Pain          PT Treatment Interventions DME instruction;Gait training;Stair training;Functional mobility training;Therapeutic activities;Therapeutic exercise;Patient/family education    PT Goals (Current goals can  be found in the Care Plan section)  Acute Rehab PT Goals Patient Stated Goal: Regain IND PT Goal Formulation: With patient Time For Goal Achievement: 05/16/16 Potential to Achieve Goals: Good    Frequency 7X/week   Barriers to discharge        Co-evaluation               End of Session Equipment Utilized During Treatment: Gait belt Activity Tolerance: Patient  tolerated treatment well Patient left: in chair;with call bell/phone within reach;with family/visitor present Nurse Communication: Mobility status         Time: [id number] PT Time Calculation (min) (ACUTE ONLY): 27 min   Charges:   PT Evaluation $PT Eval Low Complexity: 1 Procedure PT Treatments $Gait Training: 8-22 mins   PT G Codes:        Samantha Ragen 2016-06-10, 5:56 PM

## 2016-05-13 NOTE — Anesthesia Procedure Notes (Addendum)
Spinal  Patient location during procedure: OR Start time: 05/13/2016 8:30 AM End time: 05/13/2016 8:37 AM Reason for block: at surgeon's request Staffing Anesthesiologist: Annye Asa Resident/CRNA: West Pugh Performed: resident/CRNA  Preanesthetic Checklist Completed: patient identified, site marked, surgical consent, pre-op evaluation, timeout performed, IV checked, risks and benefits discussed and monitors and equipment checked Spinal Block Patient position: sitting Prep: DuraPrep Patient monitoring: heart rate, continuous pulse ox and blood pressure Approach: left paramedian Location: L3-4 Injection technique: single-shot Needle Needle type: Spinocan  Needle gauge: 22 G Needle length: 9 cm Assessment Sensory level: T6 Additional Notes Expiration of kit checked and confirmed. Patient tolerated procedure well,without complications with noted clear CSF prior to injection, mid-injection and post injection of bupivicaine. Loss of motor and sensory on exam post injection.

## 2016-05-14 LAB — BASIC METABOLIC PANEL
ANION GAP: 6 (ref 5–15)
BUN: 17 mg/dL (ref 6–20)
CALCIUM: 7.8 mg/dL — AB (ref 8.9–10.3)
CO2: 24 mmol/L (ref 22–32)
Chloride: 102 mmol/L (ref 101–111)
Creatinine, Ser: 0.71 mg/dL (ref 0.44–1.00)
GFR calc Af Amer: >60 mL/min (ref >60)
GFR calc non Af Amer: >60 mL/min (ref >60)
GLUCOSE: 136 mg/dL — AB (ref 65–99)
POTASSIUM: 3.7 mmol/L (ref 3.5–5.1)
Sodium: 132 mmol/L — ABNORMAL LOW (ref 135–145)

## 2016-05-14 LAB — CBC
HEMATOCRIT: 32.2 % — AB (ref 36.0–46.0)
Hemoglobin: 10.5 g/dL — ABNORMAL LOW (ref 12.0–15.0)
MCH: 28.8 pg (ref 26.0–34.0)
MCHC: 32.6 g/dL (ref 30.0–36.0)
MCV: 88.5 fL (ref 78.0–100.0)
Platelets: 211 10*3/uL (ref 150–400)
RBC: 3.64 MIL/uL — ABNORMAL LOW (ref 3.87–5.11)
RDW: 14.4 % (ref 11.5–15.5)
WBC: 15.3 10*3/uL — AB (ref 4.0–10.5)

## 2016-05-14 MED ORDER — RIVAROXABAN 10 MG PO TABS: 10.0000 mg | ORAL_TABLET | Freq: Every day | ORAL | 0 refills | Status: DC

## 2016-05-14 MED ORDER — TRAMADOL HCL 50 MG PO TABS: 50.0000 mg | ORAL_TABLET | Freq: Four times a day (QID) | ORAL | 1 refills | Status: DC | PRN

## 2016-05-14 MED ORDER — METHOCARBAMOL 500 MG PO TABS: 500.0000 mg | ORAL_TABLET | Freq: Four times a day (QID) | ORAL | 0 refills | Status: DC | PRN

## 2016-05-14 MED ORDER — OXYCODONE HCL 5 MG PO TABS: 5.0000 mg | ORAL_TABLET | ORAL | 0 refills | Status: DC | PRN

## 2016-05-14 NOTE — Progress Notes (Signed)
   Subjective: 1 Day Post-Op Procedure(s) (LRB): RIGHT TOTAL HIP ARTHROPLASTY ANTERIOR APPROACH (Right) Patient reports pain as mild.   Patient seen in rounds by Dr. Wynelle Link. Patient is well, but has had some minor complaints of pain in the hip, requiring pain medications We will start therapy today.  If they do well with therapy and meets all goals, then will allow home later this afternoon following therapy. Plan is to go Home after hospital stay.  Objective: Vital signs in last 24 hours: Temp:  [97.7 F (36.5 C)-98.6 F (37 C)] 98.6 F (37 C) (11/16 0700) Pulse Rate:  [62-91] 78 (11/16 0700) Resp:  [15-30] 17 (11/16 0700) BP: (112-149)/(57-85) 120/60 (11/16 0700) SpO2:  [98 %-100 %] 98 % (11/16 0700) Weight:  [71.2 kg (157 lb)] 71.2 kg (157 lb) (11/15 1130)  Intake/Output from previous day:  Intake/Output Summary (Last 24 hours) at 05/14/16 0742 Last data filed at 05/14/16 0600  Gross per 24 hour  Intake             4180 ml  Output             3492 ml  Net              688 ml    Intake/Output this shift: No intake/output data recorded.  Labs:  Recent Labs  05/14/16 0413  HGB 10.5*    Recent Labs  05/14/16 0413  WBC 15.3*  RBC 3.64*  HCT 32.2*  PLT 211    Recent Labs  05/14/16 0413  NA 132*  K 3.7  CL 102  CO2 24  BUN 17  CREATININE 0.71  GLUCOSE 136*  CALCIUM 7.8*   No results for input(s): LABPT, INR in the last 72 hours.  EXAM General - Patient is Alert, Appropriate and Oriented Extremity - Neurovascular intact Sensation intact distally Dorsiflexion/Plantar flexion intact Dressing - dressing C/D/I Motor Function - intact, moving foot and toes well on exam.  Hemovac pulled without difficulty.  Past Medical History:  Diagnosis Date  . Arthritis   . Cancer [facility])    uterine cancer   . Hypertension   . Thyroid disease     Assessment/Plan: 1 Day Post-Op Procedure(s) (LRB): RIGHT TOTAL HIP ARTHROPLASTY ANTERIOR APPROACH  (Right) Principal Problem:   OA (osteoarthritis) of hip  Estimated body mass index is 25.34 kg/m as calculated from the following:   Height as of this encounter: 5\' 6"  (1.676 m).   Weight as of this encounter: 71.2 kg (157 lb). Advance diet Up with therapy Discharge home with home health  DVT Prophylaxis - Xarelto Weight Bearing As Tolerated right Leg Hemovac Pulled Begin Therapy  If meets goals and able to go home: Discharge home with home health Diet - Cardiac diet Follow up - in 2 weeks Activity - WBAT Disposition - Home Condition Upon Discharge - Good D/C Meds - See DC Summary DVT Prophylaxis - Xarelto  Arlee Muslim, PA-C Orthopaedic Surgery 05/14/2016, 7:42 AM

## 2016-05-14 NOTE — Care Management Note (Signed)
Case Management Note  Patient Details  Name: Kenda Kloehn MRN: [id number] Date of Birth: Mar 22, 1946  Subjective/Objective:                  RIGHT TOTAL HIP ARTHROPLASTY ANTERIOR APPROACH (Right) Action/Plan: Discharge planning Expected Discharge Date:                  Expected Discharge Plan:  [place]  In-House Referral:     Discharge planning Services  CM Consult  Post Acute Care Choice:  Home Health Choice offered to:  Patient  DME Arranged:  N/A DME Agency:  NA  HH Arranged:  PT [place] Agency:  [facility] (formerly [employer])  Status of Service:  Completed, signed off  If discussed at [employer], dates discussed:    Additional Comments: CM met with pt to offer choice of home health agency. Pt chooses [facility] to render HHPT.  Referral given to [facility] rep, Tim.  Pt states she has all DME needed at home.  No other CM needs were communicated. Dellie Catholic, RN 05/14/2016, 12:11 PM

## 2016-05-14 NOTE — Discharge Summary (Signed)
Physician Discharge Summary   Patient ID: Nichole Pierce MRN: [id number] DOB/AGE: [birth year] 69 y.o.  Admit date: 05/13/2016 Discharge date: 05-14-2016  Primary Diagnosis:  Osteoarthritis of the Right hip.  Admission Diagnoses:  Past Medical History:  Diagnosis Date  . Arthritis   . Cancer [facility])    uterine cancer   . Hypertension   . Thyroid disease    Discharge Diagnoses:   Principal Problem:   OA (osteoarthritis) of hip  Estimated body mass index is 25.34 kg/m as calculated from the following:   Height as of this encounter: _0  (1.676 m).   Weight as of this encounter: 71.2 kg (157 lb).  Procedure(s) (LRB): RIGHT TOTAL HIP ARTHROPLASTY ANTERIOR APPROACH (Right)   Consults: None  HPI: Nichole Pierce is a 70 y.o. female who has advanced end-  stage arthritis of their Right  hip with progressively worsening pain and  dysfunction.The patient has failed nonoperative management and presents for  total hip arthroplasty.   Laboratory Data: Admission on 05/13/2016  Component Date Value Ref Range Status  . WBC 05/14/2016 15.3* 4.0 - 10.5 K/uL Final  . RBC 05/14/2016 3.64* 3.87 - 5.11 MIL/uL Final  . Hemoglobin 05/14/2016 10.5* 12.0 - 15.0 g/dL Final  . HCT 05/14/2016 32.2* 36.0 - 46.0 % Final  . MCV 05/14/2016 88.5  78.0 - 100.0 fL Final  . MCH 05/14/2016 28.8  26.0 - 34.0 pg Final  . MCHC 05/14/2016 32.6  30.0 - 36.0 g/dL Final  . RDW 05/14/2016 14.4  11.5 - 15.5 % Final  . Platelets 05/14/2016 211  150 - 400 K/uL Final  . Sodium 05/14/2016 132* 135 - 145 mmol/L Final  . Potassium 05/14/2016 3.7  3.5 - 5.1 mmol/L Final  . Chloride 05/14/2016 102  101 - 111 mmol/L Final  . CO2 05/14/2016 24  22 - 32 mmol/L Final  . Glucose, Bld 05/14/2016 136* 65 - 99 mg/dL Final  . BUN 05/14/2016 17  6 - 20 mg/dL Final  . Creatinine, Ser 05/14/2016 0.71  0.44 - 1.00 mg/dL Final  . Calcium 05/14/2016 7.8* 8.9 - 10.3 mg/dL Final  . GFR calc non Af Amer 05/14/2016 >60  >60 mL/min  Final  . GFR calc Af Amer 05/14/2016 >60  >60 mL/min Final   Comment: (NOTE) The eGFR has been calculated using the CKD EPI equation. This calculation has not been validated in all clinical situations. eGFR's persistently <60 mL/min signify possible Chronic Kidney Disease.   Georgiann Hahn gap 05/14/2016 6  5 - 15 Final  Hospital Outpatient Visit on 05/07/2016  Component Date Value Ref Range Status  . aPTT 05/07/2016 39* 24 - 36 seconds Final   Comment:        IF BASELINE aPTT IS ELEVATED, SUGGEST PATIENT RISK ASSESSMENT BE USED TO DETERMINE APPROPRIATE ANTICOAGULANT THERAPY.   . WBC 05/07/2016 8.9  4.0 - 10.5 K/uL Final  . RBC 05/07/2016 4.58  3.87 - 5.11 MIL/uL Final  . Hemoglobin 05/07/2016 13.5  12.0 - 15.0 g/dL Final  . HCT 05/07/2016 41.0  36.0 - 46.0 % Final  . MCV 05/07/2016 89.5  78.0 - 100.0 fL Final  . MCH 05/07/2016 29.5  26.0 - 34.0 pg Final  . MCHC 05/07/2016 32.9  30.0 - 36.0 g/dL Final  . RDW 05/07/2016 14.2  11.5 - 15.5 % Final  . Platelets 05/07/2016 229  150 - 400 K/uL Final  . Sodium 05/07/2016 139  135 - 145 mmol/L Final  . Potassium 05/07/2016 3.7  3.5 - 5.1 mmol/L Final  . Chloride 05/07/2016 104  101 - 111 mmol/L Final  . CO2 05/07/2016 28  22 - 32 mmol/L Final  . Glucose, Bld 05/07/2016 97  65 - 99 mg/dL Final  . BUN 05/07/2016 25* 6 - 20 mg/dL Final  . Creatinine, Ser 05/07/2016 0.81  0.44 - 1.00 mg/dL Final  . Calcium 05/07/2016 9.6  8.9 - 10.3 mg/dL Final  . Total Protein 05/07/2016 7.0  6.5 - 8.1 g/dL Final  . Albumin 05/07/2016 4.3  3.5 - 5.0 g/dL Final  . AST 05/07/2016 19  15 - 41 U/L Final  . ALT 05/07/2016 14  14 - 54 U/L Final  . Alkaline Phosphatase 05/07/2016 52  38 - 126 U/L Final  . Total Bilirubin 05/07/2016 0.6  0.3 - 1.2 mg/dL Final  . GFR calc non Af Amer 05/07/2016 >60  >60 mL/min Final  . GFR calc Af Amer 05/07/2016 >60  >60 mL/min Final   Comment: (NOTE) The eGFR has been calculated using the CKD EPI equation. This calculation has  not been validated in all clinical situations. eGFR's persistently <60 mL/min signify possible Chronic Kidney Disease.   . Anion gap 05/07/2016 7  5 - 15 Final  . Prothrombin Time 05/07/2016 13.2  11.4 - 15.2 seconds Final  . INR 05/07/2016 1.00   Final  . ABO/RH(D) 05/13/2016 O POS   Final  . Antibody Screen 05/13/2016 NEG   Final  . Sample Expiration 05/13/2016 05/16/2016   Final  . Extend sample reason 05/13/2016 NO TRANSFUSIONS OR PREGNANCY IN THE PAST 3 MONTHS   Final  . Color, Urine 05/07/2016 YELLOW  YELLOW Final  . APPearance 05/07/2016 CLEAR  CLEAR Final  . Specific Gravity, Urine 05/07/2016 1.015  1.005 - 1.030 Final  . pH 05/07/2016 6.0  5.0 - 8.0 Final  . Glucose, UA 05/07/2016 NEGATIVE  NEGATIVE mg/dL Final  . Hgb urine dipstick 05/07/2016 SMALL* NEGATIVE Final  . Bilirubin Urine 05/07/2016 NEGATIVE  NEGATIVE Final  . Ketones, ur 05/07/2016 NEGATIVE  NEGATIVE mg/dL Final  . Protein, ur 05/07/2016 NEGATIVE  NEGATIVE mg/dL Final  . Nitrite 05/07/2016 NEGATIVE  NEGATIVE Final  . Leukocytes, UA 05/07/2016 NEGATIVE  NEGATIVE Final  . MRSA, PCR 05/07/2016 NEGATIVE  NEGATIVE Final  . Staphylococcus aureus 05/07/2016 NEGATIVE  NEGATIVE Final   Comment:        The Xpert SA Assay (FDA approved for NASAL specimens in patients over 61 years of age), is one component of a comprehensive surveillance program.  Test performance has been validated by [facility] for patients greater than or equal to 32 year old. It is not intended to diagnose infection nor to guide or monitor treatment.   . ABO/RH(D) 05/07/2016 O POS   Final  . Squamous Epithelial / LPF 05/07/2016 0-5* NONE SEEN Final  . WBC, UA 05/07/2016 0-5  0 - 5 WBC/hpf Final  . RBC / HPF 05/07/2016 0-5  0 - 5 RBC/hpf Final  . Bacteria, UA 05/07/2016 RARE* NONE SEEN Final     X-Rays:Dg Pelvis Portable  Result Date: 05/13/2016 CLINICAL DATA:  Postop right total hip joint replacement EXAM: PORTABLE PELVIS 1-2 VIEWS  COMPARISON:  None in PACs FINDINGS: A single AP view of both hips reveals the placement of a prosthetic left hip joint. Radiographic positioning of the prosthetic components is good. The native bone is intact. A surgical drain line is present. There degenerative changes of the left hip joint. IMPRESSION: No immediate postprocedure complication  following right total hip joint prosthesis placement. Electronically Signed   By: David  Martinique M.D.   On: 05/13/2016 11:11    EKG: Orders placed or performed during the hospital encounter of 05/07/16  . EKG 12 lead  . EKG 12 lead     Hospital Course: Patient was admitted to [facility] and taken to the OR and underwent the above state procedure without complications.  Patient tolerated the procedure well and was later transferred to the recovery room and then to the orthopaedic floor for postoperative care.  They were given PO and IV analgesics for pain control following their surgery.  They were given 24 hours of postoperative antibiotics of  Anti-infectives    Start     Dose/Rate Route Frequency Ordered Stop   05/13/16 1500  ceFAZolin (ANCEF) IVPB 2g/100 mL premix     2 g 200 mL/hr over 30 Minutes Intravenous Every 6 hours 05/13/16 1139 05/13/16 2058   05/13/16 0701  ceFAZolin (ANCEF) IVPB 2g/100 mL premix     2 g 200 mL/hr over 30 Minutes Intravenous On call to O.R. 05/13/16 0701 05/13/16 0915     and started on DVT prophylaxis in the form of Xarelto.   PT and OT were ordered for total hip protocol.  The patient was allowed to be WBAT with therapy. Discharge planning was consulted to help with postop disposition and equipment needs.  Patient had a good night on the evening of surgery.  They started to get up OOB with therapy on day one.  Hemovac drain was pulled without difficulty.  Dressing was checked and  was clean and dry.  Patient was seen in rounds and was ready to go home later on day one.  Discharge home with home health Diet -  Cardiac diet Follow up - in 2 weeks Activity - WBAT Disposition - Home Condition Upon Discharge - Good D/C Meds - See DC Summary DVT Prophylaxis - Xarelto   Discharge Instructions    Call MD / Call 911    Complete by:  As directed    If you experience chest pain or shortness of breath, CALL 911 and be transported to the hospital emergency room.  If you develope a fever above 101 F, pus (white drainage) or increased drainage or redness at the wound, or calf pain, call your surgeon's office.   Change dressing    Complete by:  As directed    You may change your dressing dressing daily with sterile 4 x 4 inch gauze dressing and paper tape.  Do not submerge the incision under water.   Constipation Prevention    Complete by:  As directed    Drink plenty of fluids.  Prune juice may be helpful.  You may use a stool softener, such as Colace (over the counter) 100 mg twice a day.  Use MiraLax (over the counter) for constipation as needed.   Diet - low sodium heart healthy    Complete by:  As directed    Discharge instructions    Complete by:  As directed    Pick up stool softner and laxative for home use following surgery while on pain medications. Do not submerge incision under water. Please use good hand washing techniques while changing dressing each day. May shower starting three days after surgery. Please use a clean towel to pat the incision dry following showers. Continue to use ice for pain and swelling after surgery. Do not use any lotions or creams on the  incision until instructed by your surgeon.   Postoperative Constipation Protocol  Constipation - defined medically as fewer than three stools per week and severe constipation as less than one stool per week.  One of the most common issues patients have following surgery is constipation.  Even if you have a regular bowel pattern at home, your normal regimen is likely to be disrupted due to multiple reasons following surgery.   Combination of anesthesia, postoperative narcotics, change in appetite and fluid intake all can affect your bowels.  In order to avoid complications following surgery, here are some recommendations in order to help you during your recovery period.  Colace (docusate) - Pick up an over-the-counter form of Colace or another stool softener and take twice a day as long as you are requiring postoperative pain medications.  Take with a full glass of water daily.  If you experience loose stools or diarrhea, hold the colace until you stool forms back up.  If your symptoms do not get better within 1 week or if they get worse, check with your doctor.  Dulcolax (bisacodyl) - Pick up over-the-counter and take as directed by the product packaging as needed to assist with the movement of your bowels.  Take with a full glass of water.  Use this product as needed if not relieved by Colace only.   MiraLax (polyethylene glycol) - Pick up over-the-counter to have on hand.  MiraLax is a solution that will increase the amount of water in your bowels to assist with bowel movements.  Take as directed and can mix with a glass of water, juice, soda, coffee, or tea.  Take if you go more than two days without a movement. Do not use MiraLax more than once per day. Call your doctor if you are still constipated or irregular after using this medication for 7 days in a row.  If you continue to have problems with postoperative constipation, please contact the office for further assistance and recommendations.  If you experience "the worst abdominal pain ever" or develop nausea or vomiting, please contact the office immediatly for further recommendations for treatment.   Take Xarelto for two and a half more weeks, then discontinue Xarelto. Once the patient has completed the Xarelto, they may resume the 81 mg Aspirin.   Do not sit on low chairs, stoools or toilet seats, as it may be difficult to get up from low surfaces    Complete by:   As directed    Driving restrictions    Complete by:  As directed    No driving until released by the physician.   Increase activity slowly as tolerated    Complete by:  As directed    Lifting restrictions    Complete by:  As directed    No lifting until released by the physician.   Patient may shower    Complete by:  As directed    You may shower without a dressing once there is no drainage.  Do not wash over the wound.  If drainage remains, do not shower until drainage stops.   TED hose    Complete by:  As directed    Use stockings (TED hose) for 3 weeks on both leg(s).  You may remove them at night for sleeping.   Weight bearing as tolerated    Complete by:  As directed    Laterality:  right   Extremity:  Lower       Medication List    STOP  taking these medications   aspirin EC 81 MG tablet   calcium carbonate 1500 (600 Ca) MG Tabs tablet Commonly known as:  OSCAL   Co Q10 100 MG Caps   estradiol 0.1 mg/24hr patch Commonly known as:  CLIMARA - Dosed in mg/24 hr   Krill Oil 300 MG Caps   multivitamin with minerals Tabs tablet   naproxen sodium 220 MG tablet Commonly known as:  ANAPROX   Vitamin D 2000 units Caps     TAKE these medications   denosumab 60 MG/ML Soln injection Commonly known as:  PROLIA Inject 60 mg into the skin every 6 (six) months. Administer in upper arm, thigh, or abdomen   levothyroxine 75 MCG tablet Commonly known as:  SYNTHROID, LEVOTHROID Take 75 mcg by mouth daily before breakfast.   losartan 25 MG tablet Commonly known as:  COZAAR Take 12.5 mg by mouth daily.   methocarbamol 500 MG tablet Commonly known as:  ROBAXIN Take 1 tablet (500 mg total) by mouth every 6 (six) hours as needed for muscle spasms.   oxyCODONE 5 MG immediate release tablet Commonly known as:  Oxy IR/ROXICODONE Take 1-2 tablets (5-10 mg total) by mouth every 3 (three) hours as needed for moderate pain or severe pain.   rivaroxaban 10 MG Tabs  tablet Commonly known as:  XARELTO Take 1 tablet (10 mg total) by mouth daily with breakfast. Take Xarelto for two and a half more weeks, then discontinue Xarelto. Once the patient has completed the Xarelto, they may resume the 81 mg Aspirin.   simvastatin 20 MG tablet Commonly known as:  ZOCOR Take 20 mg by mouth every evening.   traMADol 50 MG tablet Commonly known as:  ULTRAM Take 1-2 tablets (50-100 mg total) by mouth every 6 (six) hours as needed (mild pain).      Follow-up Information    Gearlean Alf, MD. Schedule an appointment as soon as possible for a visit on 05/26/2016.   Specialty:  Orthopedic Surgery Contact information: [address] [place] [address] [phone]           Signed: Arlee Muslim, PA-C Orthopaedic Surgery 05/14/2016, 7:53 AM

## 2016-05-14 NOTE — Evaluation (Signed)
Occupational Therapy Evaluation Patient Details Name: Nichole Pierce MRN: [id number] DOB: July 11, 1945 Today's Date: 05/14/2016    History of Present Illness Pt s/p R THR   Clinical Impression   OT education complete regarding ADL activity s/p THA    Follow Up Recommendations  No OT follow up    Equipment Recommendations  None recommended by OT    Recommendations for Other Services       Precautions / Restrictions Precautions Precautions: Fall Restrictions Weight Bearing Restrictions: No Other Position/Activity Restrictions: WBAT      Mobility Bed Mobility Overal bed mobility: Needs Assistance Bed Mobility: Supine to Sit     Supine to sit: Min assist     General bed mobility comments: cues for sequence and use of L LE to self assist  Transfers Overall transfer level: Needs assistance Equipment used: Rolling walker (2 wheeled) Transfers: Sit to/from [facility] Sit to Stand: Min assist         General transfer comment: cues for LE management and use of UEs to self assist         ADL Overall ADL's : Needs assistance/impaired     Grooming: Oral care;Set up;Sitting   Upper Body Bathing: Set up;Sitting   Lower Body Bathing: Minimal assistance;Sit to/from stand;Cueing for sequencing;Cueing for safety;Cueing for compensatory techniques   Upper Body Dressing : Set up;Sitting   Lower Body Dressing: Minimal assistance;Cueing for sequencing;Cueing for compensatory techniques;Cueing for safety   Toilet Transfer: Min guard;RW;Ambulation;Cueing for sequencing;Cueing for safety   Toileting- Clothing Manipulation and Hygiene: Minimal assistance;Sit to/from stand;Cueing for safety;Cueing for sequencing;Cueing for compensatory techniques     Tub/Shower Transfer Details (indicate cue type and reason): OT demonstrated- pt and husband verbalized safety Functional mobility during ADLs: Min guard;Cueing for sequencing;Cueing for safety;Rolling  walker                 Pertinent Vitals/Pain Pain Score: 4  Pain Location:  r hip Pain Descriptors / Indicators: Sore Pain Intervention(s): Monitored during session;Repositioned;Ice applied;Limited activity within patient's tolerance;RN gave pain meds during session     Hand Dominance     Extremity/Trunk Assessment Upper Extremity Assessment Upper Extremity Assessment: Overall WFL for tasks assessed           Communication Communication Communication: No difficulties   Cognition Arousal/Alertness: Awake/alert Behavior During Therapy: WFL for tasks assessed/performed Overall Cognitive Status: Within Functional Limits for tasks assessed                                Home Living Family/patient expects to be discharged to:: Private residence Living Arrangements: Spouse/significant other Available Help at Discharge: Family Type of Home: House Home Access: Stairs to enter Technical brewer of Steps: 2 Entrance Stairs-Rails: Right;Left Home Layout: One level     Bathroom Shower/Tub: Occupational psychologist: Handicapped height     Home Equipment: Environmental consultant - 2 wheels;Walker - standard;Bedside commode          Prior Functioning/Environment Level of Independence: Independent                 OT Problem List:     OT Treatment/Interventions: Self-care/ADL training;DME and/or AE instruction;Patient/family education    OT Goals(Current goals can be found in the care plan section) Acute Rehab OT Goals Patient Stated Goal: Regain IND  OT Frequency:     Barriers to D/C:  End of Session Equipment Utilized During Treatment: Surveyor, mining Communication: Mobility status  Activity Tolerance: Patient tolerated treatment well Patient left: in chair;with call bell/phone within reach;with family/visitor present   Time: 0925-1000 OT Time Calculation (min): 35 min Charges:  OT General Charges $OT Visit: 1  Procedure OT Evaluation $OT Eval Low Complexity: 1 Procedure OT Treatments $Self Care/Home Management : 8-22 mins G-Codes:    Payton Mccallum D May 16, 2016, 10:20 AM

## 2016-05-14 NOTE — Discharge Instructions (Addendum)
° °Dr. Frank Aluisio °Total Joint Specialist °[facility] °[address] °[place], [place] [address] °[phone] ° °ANTERIOR APPROACH TOTAL HIP REPLACEMENT POSTOPERATIVE DIRECTIONS ° ° °Hip Rehabilitation, Guidelines Following Surgery  °The results of a hip operation are greatly improved after range of motion and muscle strengthening exercises. Follow all safety measures which are given to protect your hip. If any of these exercises cause increased pain or swelling in your joint, decrease the amount until you are comfortable again. Then slowly increase the exercises. Call your caregiver if you have problems or questions.  ° °HOME CARE INSTRUCTIONS  °Remove items at home which could result in a fall. This includes throw rugs or furniture in walking pathways.  °· ICE to the affected hip every three hours for 30 minutes at a time and then as needed for pain and swelling.  Continue to use ice on the hip for pain and swelling from surgery. You may notice swelling that will progress down to the foot and ankle.  This is normal after surgery.  Elevate the leg when you are not up walking on it.   °· Continue to use the breathing machine which will help keep your temperature down.  It is common for your temperature to cycle up and down following surgery, especially at night when you are not up moving around and exerting yourself.  The breathing machine keeps your lungs expanded and your temperature down. ° ° °DIET °You may resume your previous home diet once your are discharged from the hospital. ° °DRESSING / WOUND CARE / SHOWERING °You may shower 3 days after surgery, but keep the wounds dry during showering.  You may use an occlusive plastic wrap (Press'n Seal for example), NO SOAKING/SUBMERGING IN THE BATHTUB.  If the bandage gets wet, change with a clean dry gauze.  If the incision gets wet, pat the wound dry with a clean towel. °You may start showering once you are discharged home but do not  submerge the incision under water. Just pat the incision dry and apply a dry gauze dressing on daily. °Change the surgical dressing daily and reapply a dry dressing each time. ° °ACTIVITY °Walk with your walker as instructed. °Use walker as long as suggested by your caregivers. °Avoid periods of inactivity such as sitting longer than an hour when not asleep. This helps prevent blood clots.  °You may resume a sexual relationship in one month or when given the OK by your doctor.  °You may return to work once you are cleared by your doctor.  °Do not drive a car for 6 weeks or until released by you surgeon.  °Do not drive while taking narcotics. ° °WEIGHT BEARING °Weight bearing as tolerated with assist device (walker, cane, etc) as directed, use it as long as suggested by your surgeon or therapist, typically at least 4-6 weeks. ° °POSTOPERATIVE CONSTIPATION PROTOCOL °Constipation - defined medically as fewer than three stools per week and severe constipation as less than one stool per week. ° °One of the most common issues patients have following surgery is constipation.  Even if you have a regular bowel pattern at home, your normal regimen is likely to be disrupted due to multiple reasons following surgery.  Combination of anesthesia, postoperative narcotics, change in appetite and fluid intake all can affect your bowels.  In order to avoid complications following surgery, here are some recommendations in order to help you during your recovery period. ° °Colace (docusate) - Pick up an over-the-counter   form of Colace or another stool softener and take twice a day as long as you are requiring postoperative pain medications.  Take with a full glass of water daily.  If you experience loose stools or diarrhea, hold the colace until you stool forms back up.  If your symptoms do not get better within 1 week or if they get worse, check with your doctor. ° °Dulcolax (bisacodyl) - Pick up over-the-counter and take as directed  by the product packaging as needed to assist with the movement of your bowels.  Take with a full glass of water.  Use this product as needed if not relieved by Colace only.  ° °MiraLax (polyethylene glycol) - Pick up over-the-counter to have on hand.  MiraLax is a solution that will increase the amount of water in your bowels to assist with bowel movements.  Take as directed and can mix with a glass of water, juice, soda, coffee, or tea.  Take if you go more than two days without a movement. °Do not use MiraLax more than once per day. Call your doctor if you are still constipated or irregular after using this medication for 7 days in a row. ° °If you continue to have problems with postoperative constipation, please contact the office for further assistance and recommendations.  If you experience "the worst abdominal pain ever" or develop nausea or vomiting, please contact the office immediatly for further recommendations for treatment. ° °ITCHING ° If you experience itching with your medications, try taking only a single pain pill, or even half a pain pill at a time.  You can also use Benadryl over the counter for itching or also to help with sleep.  ° °TED HOSE STOCKINGS °Wear the elastic stockings on both legs for three weeks following surgery during the day but you may remove then at night for sleeping. ° °MEDICATIONS °See your medication summary on the “After Visit Summary” that the nursing staff will review with you prior to discharge.  You may have some home medications which will be placed on hold until you complete the course of blood thinner medication.  It is important for you to complete the blood thinner medication as prescribed by your surgeon.  Continue your approved medications as instructed at time of discharge. ° °PRECAUTIONS °If you experience chest pain or shortness of breath - call 911 immediately for transfer to the hospital emergency department.  °If you develop a fever greater that 101 F,  purulent drainage from wound, increased redness or drainage from wound, foul odor from the wound/dressing, or calf pain - CONTACT YOUR SURGEON.   °                                                °FOLLOW-UP APPOINTMENTS °Make sure you keep all of your appointments after your operation with your surgeon and caregivers. You should call the office at the above phone number and make an appointment for approximately two weeks after the date of your surgery or on the date instructed by your surgeon outlined in the "After Visit Summary". ° °RANGE OF MOTION AND STRENGTHENING EXERCISES  °These exercises are designed to help you keep full movement of your hip joint. Follow your caregiver's or physical therapist's instructions. Perform all exercises about fifteen times, three times per day or as directed. Exercise both hips, even if you   have had only one joint replacement. These exercises can be done on a training (exercise) mat, on the floor, on a table or on a bed. Use whatever works the best and is most comfortable for you. Use music or television while you are exercising so that the exercises are a pleasant break in your day. This will make your life better with the exercises acting as a break in routine you can look forward to.  °Lying on your back, slowly slide your foot toward your buttocks, raising your knee up off the floor. Then slowly slide your foot back down until your leg is straight again.  °Lying on your back spread your legs as far apart as you can without causing discomfort.  °Lying on your side, raise your upper leg and foot straight up from the floor as far as is comfortable. Slowly lower the leg and repeat.  °Lying on your back, tighten up the muscle in the front of your thigh (quadriceps muscles). You can do this by keeping your leg straight and trying to raise your heel off the floor. This helps strengthen the largest muscle supporting your knee.  °Lying on your back, tighten up the muscles of your  buttocks both with the legs straight and with the knee bent at a comfortable angle while keeping your heel on the floor.  ° °IF YOU ARE TRANSFERRED TO A SKILLED REHAB FACILITY °If the patient is transferred to a skilled rehab facility following release from the hospital, a list of the current medications will be sent to the facility for the patient to continue.  When discharged from the skilled rehab facility, please have the facility set up the patient's Home Health Physical Therapy prior to being released. Also, the skilled facility will be responsible for providing the patient with their medications at time of release from the facility to include their pain medication, the muscle relaxants, and their blood thinner medication. If the patient is still at the rehab facility at time of the two week follow up appointment, the skilled rehab facility will also need to assist the patient in arranging follow up appointment in our office and any transportation needs. ° °MAKE SURE YOU:  °Understand these instructions.  °Get help right away if you are not doing well or get worse.  ° ° °Pick up stool softner and laxative for home use following surgery while on pain medications. °Do not submerge incision under water. °Please use good hand washing techniques while changing dressing each day. °May shower starting three days after surgery. °Please use a clean towel to pat the incision dry following showers. °Continue to use ice for pain and swelling after surgery. °Do not use any lotions or creams on the incision until instructed by your surgeon. ° °Take Xarelto for two and a half more weeks, then discontinue Xarelto. °Once the patient has completed the Xarelto, they may resume the 81 mg Aspirin. ° ° ° °Information on my medicine - XARELTO® (Rivaroxaban) ° °This medication education was reviewed with me or my healthcare representative as part of my discharge preparation.  The pharmacist that spoke with me during my hospital stay  was:  Camry Robello, RPH ° °Why was Xarelto® prescribed for you? °Xarelto® was prescribed for you to reduce the risk of blood clots forming after orthopedic surgery. The medical term for these abnormal blood clots is venous thromboembolism (VTE). ° °What do you need to know about xarelto® ? °Take your Xarelto® ONCE DAILY at the same time every day. °  You may take it either with or without food. ° °If you have difficulty swallowing the tablet whole, you may crush it and mix in applesauce just prior to taking your dose. ° °Take Xarelto® exactly as prescribed by your doctor and DO NOT stop taking Xarelto® without talking to the doctor who prescribed the medication.  Stopping without other VTE prevention medication to take the place of Xarelto® may increase your risk of developing a clot. ° °After discharge, you should have regular check-up appointments with your healthcare provider that is prescribing your Xarelto®.   ° °What do you do if you miss a dose? °If you miss a dose, take it as soon as you remember on the same day then continue your regularly scheduled once daily regimen the next day. Do not take two doses of Xarelto® on the same day.  ° °Important Safety Information °A possible side effect of Xarelto® is bleeding. You should call your healthcare provider right away if you experience any of the following: °? Bleeding from an injury or your nose that does not stop. °? Unusual colored urine (red or dark brown) or unusual colored stools (red or black). °? Unusual bruising for unknown reasons. °? A serious fall or if you hit your head (even if there is no bleeding). ° °Some medicines may interact with Xarelto® and might increase your risk of bleeding while on Xarelto®. To help avoid this, consult your healthcare provider or pharmacist prior to using any new prescription or non-prescription medications, including herbals, vitamins, non-steroidal anti-inflammatory drugs (NSAIDs) and supplements. ° °This website has  more information on Xarelto®: www.xarelto.com. ° ° °

## 2016-05-14 NOTE — Progress Notes (Signed)
Physical Therapy Treatment Patient Details Name: Nichole Pierce MRN: [id number] DOB: 11/11/1945 Today's Date: 05/14/2016    History of Present Illness Pt s/p R THR    PT Comments    POD # 1 am session Pt OOB in recliner eager to D/C to home.  Assisted with amb a greater distance in hallway and practiced 2 stairs with spouse.  Performed all supine THR TE's following HEP handout.  Instructed on proper tech as well as freq and use of ICE.    Follow Up Recommendations  Home health PT     Equipment Recommendations  None recommended by PT    Recommendations for Other Services       Precautions / Restrictions Precautions Precautions: Fall Restrictions Weight Bearing Restrictions: No Other Position/Activity Restrictions: WBAT    Mobility  Bed Mobility Overal bed mobility: Needs Assistance Bed Mobility: Supine to Sit     Supine to sit: Min assist     General bed mobility comments: NT OOB in recliner  Transfers Overall transfer level: Needs assistance Equipment used: Rolling walker (2 wheeled) Transfers: Sit to/from Stand Sit to Stand: Supervision         General transfer comment: cues for LE management and use of UEs to self assist  Ambulation/Gait Ambulation/Gait assistance: Supervision;Min guard Ambulation Distance (Feet): 145 Feet Assistive device: Rolling walker (2 wheeled) Gait Pattern/deviations: Step-to pattern;Step-through pattern Gait velocity: decr   General Gait Details: cues for posture, position from RW and initial sequence   Stairs Stairs: Yes Stairs assistance: Min assist Stair Management: One rail Right;Step to pattern;Forwards Number of Stairs: 2 General stair comments: with spouse present and 25% VC's on proper sequencing and safety  Wheelchair Mobility    Modified Rankin (Stroke Patients Only)       Balance                                    Cognition Arousal/Alertness: Awake/alert Behavior During Therapy: WFL  for tasks assessed/performed Overall Cognitive Status: Within Functional Limits for tasks assessed                      Exercises   Total Hip Replacement TE's 10 reps ankle pumps 10 reps knee presses 10 reps heel slides 10 reps SAQ's 10 reps ABD Followed by ICE    General Comments        Pertinent Vitals/Pain Pain Assessment: 0-10 Pain Score: 3  Pain Location: R hip Pain Descriptors / Indicators: Sore Pain Intervention(s): Monitored during session;Ice applied;Repositioned    Home Living Family/patient expects to be discharged to:: Private residence Living Arrangements: Spouse/significant other Available Help at Discharge: Family Type of Home: House Home Access: Stairs to enter Entrance Stairs-Rails: Right;Left Home Layout: One level Home Equipment: Environmental consultant - 2 wheels;Walker - standard;Bedside commode      Prior Function Level of Independence: Independent          PT Goals (current goals can now be found in the care plan section) Acute Rehab PT Goals Patient Stated Goal: Regain IND Progress towards PT goals: Progressing toward goals    Frequency    7X/week      PT Plan Current plan remains appropriate    Co-evaluation             End of Session Equipment Utilized During Treatment: Gait belt Activity Tolerance: Patient tolerated treatment well Patient left: in chair;with call bell/phone within reach;with  family/visitor present     Time: [id number] PT Time Calculation (min) (ACUTE ONLY): 28 min  Charges:  $Gait Training: 8-22 mins $Therapeutic Exercise: 8-22 mins                    G Codes:      Rica Koyanagi  PTA [facility]  Acute  Rehab Pager      [phone]

## 2016-05-14 NOTE — Progress Notes (Signed)
Discharged from floor via w/c for transport home by car. Spouse & belongings with pt. No changes in assessment. Junko Ohagan  

## 2016-12-28 ENCOUNTER — Ambulatory Visit: Payer: Self-pay | Admitting: Orthopedic Surgery

## 2017-01-05 ENCOUNTER — Other Ambulatory Visit (HOSPITAL_COMMUNITY): Payer: Self-pay | Admitting: Emergency Medicine

## 2017-01-05 NOTE — Progress Notes (Signed)
EKG 05-07-16 epic

## 2017-01-05 NOTE — Patient Instructions (Addendum)
Nichole Pierce  01/05/2017   Your procedure is scheduled on: 01-13-17  Report to [facility] Main  Entrance    Report to admitting at [facility]   Call this number if you have problems the morning of surgery  [phone]   Remember: ONLY 1 PERSON MAY GO WITH YOU TO SHORT STAY TO GET  READY MORNING OF YOUR SURGERY.  Do not eat food or drink liquids :After Midnight.     Take these medicines the morning of surgery with A SIP OF WATER: levothyroxine(synthroid) , eye drops as needed                                You may not have any metal on your body including hair pins and              piercings  Do not wear jewelry, make-up, lotions, powders or perfumes, deodorant             Do not wear nail polish.  Do not shave  48 hours prior to surgery.        Do not bring valuables to the hospital. [place].  Contacts, dentures or bridgework may not be worn into surgery.  Leave suitcase in the car. After surgery it may be brought to your room.                Please read over the following fact sheets you were given: _____________________________________________________________________           [facility] - Preparing for Surgery Before surgery, you can play an important role.  Because skin is not sterile, your skin needs to be as free of germs as possible.  You can reduce the number of germs on your skin by washing with CHG (chlorahexidine gluconate) soap before surgery.  CHG is an antiseptic cleaner which kills germs and bonds with the skin to continue killing germs even after washing. Please DO NOT use if you have an allergy to CHG or antibacterial soaps.  If your skin becomes reddened/irritated stop using the CHG and inform your nurse when you arrive at Short Stay. Do not shave (including legs and underarms) for at least 48 hours prior to the first CHG shower.  You may shave your face/neck. Please follow these  instructions carefully:  1.  Shower with CHG Soap the night before surgery and the  morning of Surgery.  2.  If you choose to wash your hair, wash your hair first as usual with your  normal  shampoo.  3.  After you shampoo, rinse your hair and body thoroughly to remove the  shampoo.                           4.  Use CHG as you would any other liquid soap.  You can apply chg directly  to the skin and wash                       Gently with a scrungie or clean washcloth.  5.  Apply the CHG Soap to your body ONLY FROM THE NECK DOWN.   Do not use on face/ open  Wound or open sores. Avoid contact with eyes, ears mouth and genitals (private parts).                       Wash face,  Genitals (private parts) with your normal soap.             6.  Wash thoroughly, paying special attention to the area where your surgery  will be performed.  7.  Thoroughly rinse your body with warm water from the neck down.  8.  DO NOT shower/wash with your normal soap after using and rinsing off  the CHG Soap.                9.  Pat yourself dry with a clean towel.            10.  Wear clean pajamas.            11.  Place clean sheets on your bed the night of your first shower and do not  sleep with pets. Day of Surgery : Do not apply any lotions/deodorants the morning of surgery.  Please wear clean clothes to the hospital/surgery center.  FAILURE TO FOLLOW THESE INSTRUCTIONS MAY RESULT IN THE CANCELLATION OF YOUR SURGERY PATIENT SIGNATURE_________________________________  NURSE SIGNATURE__________________________________  ________________________________________________________________________   Nichole Pierce  An incentive spirometer is a tool that can help keep your lungs clear and active. This tool measures how well you are filling your lungs with each breath. Taking long deep breaths may help reverse or decrease the chance of developing breathing (pulmonary) problems (especially  infection) following:  A long period of time when you are unable to move or be active. BEFORE THE PROCEDURE   If the spirometer includes an indicator to show your best effort, your nurse or respiratory therapist will set it to a desired goal.  If possible, sit up straight or lean slightly forward. Try not to slouch.  Hold the incentive spirometer in an upright position. INSTRUCTIONS FOR USE  1. Sit on the edge of your bed if possible, or sit up as far as you can in bed or on a chair. 2. Hold the incentive spirometer in an upright position. 3. Breathe out normally. 4. Place the mouthpiece in your mouth and seal your lips tightly around it. 5. Breathe in slowly and as deeply as possible, raising the piston or the ball toward the top of the column. 6. Hold your breath for 3-5 seconds or for as long as possible. Allow the piston or ball to fall to the bottom of the column. 7. Remove the mouthpiece from your mouth and breathe out normally. 8. Rest for a few seconds and repeat Steps 1 through 7 at least 10 times every 1-2 hours when you are awake. Take your time and take a few normal breaths between deep breaths. 9. The spirometer may include an indicator to show your best effort. Use the indicator as a goal to work toward during each repetition. 10. After each set of 10 deep breaths, practice coughing to be sure your lungs are clear. If you have an incision (the cut made at the time of surgery), support your incision when coughing by placing a pillow or rolled up towels firmly against it. Once you are able to get out of bed, walk around indoors and cough well. You may stop using the incentive spirometer when instructed by your caregiver.  RISKS AND COMPLICATIONS  Take your time so you do not get  dizzy or light-headed.  If you are in pain, you may need to take or ask for pain medication before doing incentive spirometry. It is harder to take a deep breath if you are having pain. AFTER  USE  Rest and breathe slowly and easily.  It can be helpful to keep track of a log of your progress. Your caregiver can provide you with a simple table to help with this. If you are using the spirometer at home, follow these instructions: [place] IF:   You are having difficultly using the spirometer.  You have trouble using the spirometer as often as instructed.  Your pain medication is not giving enough relief while using the spirometer.  You develop fever of 100.5 F (38.1 C) or higher. SEEK IMMEDIATE MEDICAL CARE IF:   You cough up bloody sputum that had not been present before.  You develop fever of 102 F (38.9 C) or greater.  You develop worsening pain at or near the incision site. MAKE SURE YOU:   Understand these instructions.  Will watch your condition.  Will get help right away if you are not doing well or get worse. Document Released: 10/26/2006 Document Revised: 09/07/2011 Document Reviewed: 12/27/2006 ExitCare Patient Information 2014 ExitCare, [place].   ________________________________________________________________________  WHAT IS A BLOOD TRANSFUSION? Blood Transfusion Information  A transfusion is the replacement of blood or some of its parts. Blood is made up of multiple cells which provide different functions.  Red blood cells carry oxygen and are used for blood loss replacement.  White blood cells fight against infection.  Platelets control bleeding.  Plasma helps clot blood.  Other blood products are available for specialized needs, such as hemophilia or other clotting disorders. BEFORE THE TRANSFUSION  Who gives blood for transfusions?   Healthy volunteers who are fully evaluated to make sure their blood is safe. This is blood bank blood. Transfusion therapy is the safest it has ever been in the practice of medicine. Before blood is taken from a donor, a complete history is taken to make sure that person has no history of diseases  nor engages in risky social behavior (examples are intravenous drug use or sexual activity with multiple partners). The donor's travel history is screened to minimize risk of transmitting infections, such as malaria. The donated blood is tested for signs of infectious diseases, such as HIV and hepatitis. The blood is then tested to be sure it is compatible with you in order to minimize the chance of a transfusion reaction. If you or a relative donates blood, this is often done in anticipation of surgery and is not appropriate for emergency situations. It takes many days to process the donated blood. RISKS AND COMPLICATIONS Although transfusion therapy is very safe and saves many lives, the main dangers of transfusion include:   Getting an infectious disease.  Developing a transfusion reaction. This is an allergic reaction to something in the blood you were given. Every precaution is taken to prevent this. The decision to have a blood transfusion has been considered carefully by your caregiver before blood is given. Blood is not given unless the benefits outweigh the risks. AFTER THE TRANSFUSION  Right after receiving a blood transfusion, you will usually feel much better and more energetic. This is especially true if your red blood cells have gotten low (anemic). The transfusion raises the level of the red blood cells which carry oxygen, and this usually causes an energy increase.  The nurse administering the transfusion will  monitor you carefully for complications. HOME CARE INSTRUCTIONS  No special instructions are needed after a transfusion. You may find your energy is better. Speak with your caregiver about any limitations on activity for underlying diseases you may have. SEEK MEDICAL CARE IF:   Your condition is not improving after your transfusion.  You develop redness or irritation at the intravenous (IV) site. SEEK IMMEDIATE MEDICAL CARE IF:  Any of the following symptoms occur over the  next 12 hours:  Shaking chills.  You have a temperature by mouth above 102 F (38.9 C), not controlled by medicine.  Chest, back, or muscle pain.  People around you feel you are not acting correctly or are confused.  Shortness of breath or difficulty breathing.  Dizziness and fainting.  You get a rash or develop hives.  You have a decrease in urine output.  Your urine turns a dark color or changes to pink, red, or brown. Any of the following symptoms occur over the next 10 days:  You have a temperature by mouth above 102 F (38.9 C), not controlled by medicine.  Shortness of breath.  Weakness after normal activity.  The white part of the eye turns yellow (jaundice).  You have a decrease in the amount of urine or are urinating less often.  Your urine turns a dark color or changes to pink, red, or brown. Document Released: 06/12/2000 Document Revised: 09/07/2011 Document Reviewed: 01/30/2008 [facility] Patient Information 2014 [place], [place].  _______________________________________________________________________

## 2017-01-06 ENCOUNTER — Encounter (HOSPITAL_COMMUNITY): Payer: Self-pay

## 2017-01-06 ENCOUNTER — Encounter (HOSPITAL_COMMUNITY)
Admission: RE | Admit: 2017-01-06 | Discharge: 2017-01-06 | Disposition: A | Discharge status: Home / Self Care | Payer: Medicare Other | Source: Ambulatory Visit | Attending: Orthopedic Surgery | Admitting: Orthopedic Surgery

## 2017-01-06 DIAGNOSIS — Z01818 Encounter for other preprocedural examination: Secondary | ICD-10-CM | POA: Insufficient documentation

## 2017-01-06 DIAGNOSIS — M1612 Unilateral primary osteoarthritis, left hip: Secondary | ICD-10-CM | POA: Diagnosis not present

## 2017-01-06 LAB — CBC
HCT: 41.1 % (ref 36.0–46.0)
Hemoglobin: 13.9 g/dL (ref 12.0–15.0)
MCH: 29.6 pg (ref 26.0–34.0)
MCHC: 33.8 g/dL (ref 30.0–36.0)
MCV: 87.6 fL (ref 78.0–100.0)
Platelets: 238 10*3/uL (ref 150–400)
RBC: 4.69 MIL/uL (ref 3.87–5.11)
RDW: 14.4 % (ref 11.5–15.5)
WBC: 8 10*3/uL (ref 4.0–10.5)

## 2017-01-06 LAB — COMPREHENSIVE METABOLIC PANEL
ALBUMIN: 4.1 g/dL (ref 3.5–5.0)
ALK PHOS: 52 U/L (ref 38–126)
ALT: 12 U/L — AB (ref 14–54)
AST: 18 U/L (ref 15–41)
Anion gap: 8 (ref 5–15)
BILIRUBIN TOTAL: 0.4 mg/dL (ref 0.3–1.2)
BUN: 21 mg/dL — ABNORMAL HIGH (ref 6–20)
CO2: 27 mmol/L (ref 22–32)
CREATININE: 0.65 mg/dL (ref 0.44–1.00)
Calcium: 9.9 mg/dL (ref 8.9–10.3)
Chloride: 104 mmol/L (ref 101–111)
GFR calc Af Amer: >60 mL/min (ref >60)
GFR calc non Af Amer: >60 mL/min (ref >60)
Glucose, Bld: 87 mg/dL (ref 65–99)
Potassium: 4.3 mmol/L (ref 3.5–5.1)
SODIUM: 139 mmol/L (ref 135–145)
Total Protein: 6.7 g/dL (ref 6.5–8.1)

## 2017-01-06 LAB — APTT: aPTT: 40 seconds — ABNORMAL HIGH (ref 24–36)

## 2017-01-06 LAB — PROTIME-INR
INR: 0.99
PROTHROMBIN TIME: 13.1 s (ref 11.4–15.2)

## 2017-01-06 LAB — SURGICAL PCR SCREEN
MRSA, PCR: NEGATIVE
Staphylococcus aureus: NEGATIVE

## 2017-01-07 NOTE — Progress Notes (Signed)
APTT result routed via epic to dr Boeing

## 2017-01-09 ENCOUNTER — Ambulatory Visit: Payer: Self-pay | Admitting: Orthopedic Surgery

## 2017-01-09 NOTE — H&P (Signed)
Nichole Pierce  DOB: 1945-12-16 Married / Language: English / Race: White Female  Date of Admission:  01/13/2017  CC:  Left hip pain History of Present Illness  The patient is a 71 year old female who comes in  for a preoperative History and Physical. The patient is scheduled for a left total hip arthroplasty (anterior) to be performed by Dr. Dione Plover. Aluisio, MD at [facility] on 01/13/2017. The patient is a 71 year old female who comes in s/p out from right total hip arthroplasty. The patient states that she is doing very well at this time. The pain is under excellent control at this time and describes their pain as none. They are currently on no medication for their pain. The patient is currently doing home exercise program. she has gone back to ballroom dancing. The patient feels that they are progressing well at this time. She would like to get scheduled for left THA. She said her right hip is doing fantastic at this time. She is really pleased with it. The only thing holding her back now is her left hip. She does have documented bone on bone arthritis on the left. She feels like she is ready to proceed with the other side. Left hip is very symptomatic and she wants to get that fixed. She does have bone-on-bone arthritis with significant pain and dysfunction, so I do believe that total hip arthroplasty would be the best means for getting her better. They have been treated conservatively in the past for the above stated problem and despite conservative measures, they continue to have progressive pain and severe functional limitations and dysfunction. They have failed non-operative management including home exercise, medications, and exercises. It is felt that they would benefit from undergoing total joint replacement. Risks and benefits of the procedure have been discussed with the patient and they elect to proceed with surgery. There are no active contraindications to surgery such as  ongoing infection or rapidly progressive neurological disease.    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 Lisinopril *ANTIHYPERTENSIVES*  Cough.  Family History Cancer  father Heart Disease  grandmother mothers side Liver Disease, Chronic  grandfather mothers side Severe allergy  child Father  Deceased. age 63, Colon Cancer Mother  Deceased. age 51, Cardic Arrest  Social History Alcohol use  never consumed alcohol Current work status  working part time Drug/Alcohol Rehab (Currently)  no Drug/Alcohol Rehab (Previously)  no Exercise  Exercises daily; does other Illicit drug use  no Living situation  live with spouse Marital status  married Number of flights of stairs before winded  2-3 Pain Contract  no Tobacco / smoke exposure  no Tobacco use  never smoker  Medication History  Prolia Injections Active. Aleve Active. Ibuprofen Active. Multivitaimin Active. Co-Q-10 Active. Krill Oil Active. Ascorbic Acid (1000MG  Tablet, Oral) Active. Denosumab (60MG /ML Solution, Subcutaneous) Active. Omega-3 (180-270MG  Capsule, Oral) Active. Ubiquinol (100MG  Capsule, Oral) Active. Calcium (500MG  Tablet, Oral) Active. Cholecalciferol (1000UNIT Capsule, Oral) Active. Estradiol (0.025MG /24HR Patch TW, Transdermal) Active. Levothyroxine Sodium (75MCG Tablet, Oral) Active. Simvastatin (20MG  Tablet, Oral) Active. Losartan Potassium (50MG  Tablet, Oral) Active. Aspirin (81MG  Tablet Chewable, 1 (one) Oral) Active.  Past Surgical History  Colon Polyp Removal - Colonoscopy  Total Hip Replacement - Right [05/13/2016]: Hysterectomy   complete (cancerous) Thyroidectomy; Subtotal  right Tonsillectomy  ORIF Left Patella  Salivary Gland Surgery  Cataract Extraction-Bilateral   Review of Systems  General Not Present- Chills, Fatigue, Fever, Memory Loss, Night Sweats, Weight Gain and Weight Loss. Skin Not Present- Eczema, Hives, Itching, Lesions and Rash. HEENT Not Present- Dentures, Double Vision, Headache, Hearing Loss, Tinnitus and Visual Loss. Respiratory Not Present- Allergies, Chronic Cough, Coughing up blood, Shortness of breath at rest and Shortness of breath with exertion. Cardiovascular Not Present- Chest Pain, Difficulty Breathing Lying Down, Murmur, Palpitations, Racing/skipping heartbeats and Swelling. Gastrointestinal Not Present- Abdominal Pain, Bloody Stool, Constipation, Diarrhea, Difficulty Swallowing, Heartburn, Jaundice, Loss of appetitie, Nausea and Vomiting. Female Genitourinary Not Present- Blood in Urine, Discharge, Flank Pain, Incontinence, Painful Urination, Urgency, Urinary frequency, Urinary Retention, Urinating at Night and Weak urinary stream. Musculoskeletal Not Present- Back Pain, Joint Pain, Joint Swelling, Morning Stiffness, Muscle Pain, Muscle Weakness and Spasms. Neurological Not Present- Blackout spells, Difficulty with balance, Dizziness, Paralysis, Tremor and Weakness. Psychiatric Not Present- Insomnia.  Vitals  Weight: 155 lb Height: 65.5in Weight was reported by patient. Height was reported by patient. Body Surface Area: 1.78 m Body Mass Index: 25.4 kg/m  Pulse: 72 (Regular)  BP: 122/78 (Sitting, Right Arm, Standard)       Physical Exam General Mental Status -Alert, cooperative and good historian. General Appearance-pleasant, Not in acute distress. Orientation-Oriented X3. Build & Nutrition-Well nourished and Well developed.  Head and Neck Head-normocephalic, atraumatic . Neck Global Assessment - supple, no bruit auscultated on the right, no  bruit auscultated on the left.  Eye Pupil - Bilateral-Regular and Round. Motion - Bilateral-EOMI.  Chest and Lung Exam Auscultation Breath sounds - clear at anterior chest wall and clear at posterior chest wall. Adventitious sounds - No Adventitious sounds.  Cardiovascular Auscultation Rhythm - Regular rate and rhythm. Heart Sounds - S1 WNL and S2 WNL. Murmurs & Other Heart Sounds: Murmur 1 - Location - Aortic Area. Timing - Early systolic. Grade - II/VI.  Abdomen Palpation/Percussion Tenderness - Abdomen is non-tender to palpation. Rigidity (guarding) - Abdomen is soft. Auscultation Auscultation of the abdomen reveals - Bowel sounds normal.  Female Genitourinary Note: Not done, not pertinent to present illness   Musculoskeletal Note: Left hip flexion about 100, minimal internal rotation about 20 degrees abduction, 20 external rotation.  RADIOGRAPHS She has bone on bone arthritis throughout the left hip with subchondral cystic formation, osteophyte formation.     Assessment & Plan Primary osteoarthritis of left hip (M16.12) Status post total hip replacement, right (M62.947)  Note:Surgical Plans: Left Total Hip Replacement - Anterior Approach  Disposition: Home with HHPT  PCP: Dr. Drinda Butts  Topical TXA  Anesthesia Issues: None except for nausea  Patient was instructed on what medications to stop prior to surgery.  Signed electronically by Joelene Millin, III PA-C

## 2017-01-13 ENCOUNTER — Encounter (HOSPITAL_COMMUNITY): Payer: Self-pay

## 2017-01-13 ENCOUNTER — Inpatient Hospital Stay (HOSPITAL_COMMUNITY): Payer: Medicare Other

## 2017-01-13 ENCOUNTER — Inpatient Hospital Stay (HOSPITAL_COMMUNITY): Payer: Medicare Other | Admitting: Certified Registered Nurse Anesthetist

## 2017-01-13 ENCOUNTER — Inpatient Hospital Stay (HOSPITAL_COMMUNITY)
Admission: RE | Admit: 2017-01-13 | Discharge: 2017-01-14 | DRG: 470 | Disposition: A | Discharge status: Home / Self Care | Payer: Medicare Other | Source: Ambulatory Visit | Attending: Orthopedic Surgery | Admitting: Orthopedic Surgery

## 2017-01-13 ENCOUNTER — Encounter (HOSPITAL_COMMUNITY)
Admission: RE | Disposition: A | Discharge status: Home / Self Care | Payer: Self-pay | Source: Ambulatory Visit | Attending: Orthopedic Surgery

## 2017-01-13 DIAGNOSIS — I10 Essential (primary) hypertension: Secondary | ICD-10-CM | POA: Diagnosis present

## 2017-01-13 DIAGNOSIS — Z881 Allergy status to other antibiotic agents status: Secondary | ICD-10-CM | POA: Diagnosis not present

## 2017-01-13 DIAGNOSIS — M169 Osteoarthritis of hip, unspecified: Secondary | ICD-10-CM | POA: Diagnosis present

## 2017-01-13 DIAGNOSIS — Z90710 Acquired absence of both cervix and uterus: Secondary | ICD-10-CM | POA: Diagnosis not present

## 2017-01-13 DIAGNOSIS — M25752 Osteophyte, left hip: Secondary | ICD-10-CM | POA: Diagnosis present

## 2017-01-13 DIAGNOSIS — Z7982 Long term (current) use of aspirin: Secondary | ICD-10-CM | POA: Diagnosis not present

## 2017-01-13 DIAGNOSIS — M25552 Pain in left hip: Secondary | ICD-10-CM | POA: Diagnosis present

## 2017-01-13 DIAGNOSIS — Z8542 Personal history of malignant neoplasm of other parts of uterus: Secondary | ICD-10-CM

## 2017-01-13 DIAGNOSIS — E890 Postprocedural hypothyroidism: Secondary | ICD-10-CM | POA: Diagnosis present

## 2017-01-13 DIAGNOSIS — Z96649 Presence of unspecified artificial hip joint: Secondary | ICD-10-CM

## 2017-01-13 DIAGNOSIS — Z96641 Presence of right artificial hip joint: Secondary | ICD-10-CM | POA: Diagnosis present

## 2017-01-13 DIAGNOSIS — E7800 Pure hypercholesterolemia, unspecified: Secondary | ICD-10-CM | POA: Diagnosis present

## 2017-01-13 DIAGNOSIS — Z791 Long term (current) use of non-steroidal anti-inflammatories (NSAID): Secondary | ICD-10-CM

## 2017-01-13 DIAGNOSIS — Z79899 Other long term (current) drug therapy: Secondary | ICD-10-CM

## 2017-01-13 DIAGNOSIS — Z885 Allergy status to narcotic agent status: Secondary | ICD-10-CM

## 2017-01-13 DIAGNOSIS — Z800 Family history of malignant neoplasm of digestive organs: Secondary | ICD-10-CM

## 2017-01-13 DIAGNOSIS — M1612 Unilateral primary osteoarthritis, left hip: Principal | ICD-10-CM | POA: Diagnosis present

## 2017-01-13 DIAGNOSIS — Z888 Allergy status to other drugs, medicaments and biological substances status: Secondary | ICD-10-CM

## 2017-01-13 DIAGNOSIS — Z86010 Personal history of colonic polyps: Secondary | ICD-10-CM | POA: Diagnosis not present

## 2017-01-13 DIAGNOSIS — Z8249 Family history of ischemic heart disease and other diseases of the circulatory system: Secondary | ICD-10-CM

## 2017-01-13 HISTORY — PX: TOTAL HIP ARTHROPLASTY: SHX124

## 2017-01-13 LAB — TYPE AND SCREEN
ABO/RH(D): O POS
ANTIBODY SCREEN: NEGATIVE

## 2017-01-13 SURGERY — ARTHROPLASTY, HIP, TOTAL, ANTERIOR APPROACH
Anesthesia: Spinal | Site: Hip | Laterality: Left

## 2017-01-13 MED ORDER — PHENOL 1.4 % MT LIQD: 1.0000 | OROMUCOSAL | Status: DC | PRN

## 2017-01-13 MED ORDER — METOCLOPRAMIDE HCL 5 MG PO TABS: 5.0000 mg | ORAL_TABLET | Freq: Three times a day (TID) | ORAL | Status: DC | PRN

## 2017-01-13 MED ORDER — FENTANYL CITRATE (PF) 100 MCG/2ML IJ SOLN
INTRAMUSCULAR | Status: AC
  Filled 2017-01-13: qty 2

## 2017-01-13 MED ORDER — DIPHENHYDRAMINE HCL 12.5 MG/5ML PO ELIX: 12.5000 mg | ORAL_SOLUTION | ORAL | Status: DC | PRN

## 2017-01-13 MED ORDER — ACETAMINOPHEN 500 MG PO TABS
1000.0000 mg | ORAL_TABLET | Freq: Four times a day (QID) | ORAL | Status: DC
  Administered 2017-01-13 – 2017-01-14 (×3): 1000 mg via ORAL
  Filled 2017-01-13 (×5): qty 2

## 2017-01-13 MED ORDER — TRAMADOL HCL 50 MG PO TABS: 50.0000 mg | ORAL_TABLET | Freq: Four times a day (QID) | ORAL | Status: DC | PRN

## 2017-01-13 MED ORDER — LACTATED RINGERS IV SOLN
INTRAVENOUS | Status: DC
  Administered 2017-01-13 (×2): via INTRAVENOUS

## 2017-01-13 MED ORDER — PROPOFOL 10 MG/ML IV BOLUS
INTRAVENOUS | Status: DC | PRN
  Administered 2017-01-13 (×2): 20 mg via INTRAVENOUS

## 2017-01-13 MED ORDER — METHOCARBAMOL 1000 MG/10ML IJ SOLN
500.0000 mg | Freq: Four times a day (QID) | INTRAVENOUS | Status: DC | PRN
  Administered 2017-01-13: 500 mg via INTRAVENOUS
  Filled 2017-01-13: qty 550

## 2017-01-13 MED ORDER — ACETAMINOPHEN 325 MG PO TABS: 650.0000 mg | ORAL_TABLET | Freq: Four times a day (QID) | ORAL | Status: DC | PRN

## 2017-01-13 MED ORDER — MORPHINE SULFATE (PF) 4 MG/ML IV SOLN
INTRAVENOUS | Status: AC
  Administered 2017-01-13: 1 mg via INTRAVENOUS
  Filled 2017-01-13: qty 1

## 2017-01-13 MED ORDER — DEXAMETHASONE SODIUM PHOSPHATE 10 MG/ML IJ SOLN
10.0000 mg | Freq: Once | INTRAMUSCULAR | Status: AC
  Administered 2017-01-13: 10 mg via INTRAVENOUS

## 2017-01-13 MED ORDER — SCOPOLAMINE 1 MG/3DAYS TD PT72
MEDICATED_PATCH | TRANSDERMAL | Status: AC
  Filled 2017-01-13: qty 1

## 2017-01-13 MED ORDER — BUPIVACAINE HCL (PF) 0.75 % IJ SOLN
INTRAMUSCULAR | Status: DC | PRN
  Administered 2017-01-13: 1.8 mL via INTRATHECAL

## 2017-01-13 MED ORDER — 0.9 % SODIUM CHLORIDE (POUR BTL) OPTIME
TOPICAL | Status: DC | PRN
  Administered 2017-01-13: 1000 mL

## 2017-01-13 MED ORDER — ONDANSETRON HCL 4 MG/2ML IJ SOLN
INTRAMUSCULAR | Status: DC | PRN
  Administered 2017-01-13: 4 mg via INTRAVENOUS

## 2017-01-13 MED ORDER — PROPOFOL 10 MG/ML IV BOLUS
INTRAVENOUS | Status: AC
  Filled 2017-01-13: qty 60

## 2017-01-13 MED ORDER — FENTANYL CITRATE (PF) 100 MCG/2ML IJ SOLN: 25.0000 ug | INTRAMUSCULAR | Status: DC | PRN

## 2017-01-13 MED ORDER — SODIUM CHLORIDE 0.9 % IV SOLN
INTRAVENOUS | Status: DC
  Administered 2017-01-13: 17:00:00 via INTRAVENOUS

## 2017-01-13 MED ORDER — SCOPOLAMINE 1 MG/3DAYS TD PT72
MEDICATED_PATCH | TRANSDERMAL | Status: DC | PRN
  Administered 2017-01-13: 1 via TRANSDERMAL

## 2017-01-13 MED ORDER — DOCUSATE SODIUM 100 MG PO CAPS
100.0000 mg | ORAL_CAPSULE | Freq: Two times a day (BID) | ORAL | Status: DC
  Administered 2017-01-13 – 2017-01-14 (×2): 100 mg via ORAL
  Filled 2017-01-13 (×2): qty 1

## 2017-01-13 MED ORDER — PHENYLEPHRINE HCL 10 MG/ML IJ SOLN
INTRAMUSCULAR | Status: DC | PRN
  Administered 2017-01-13: 50 ug/min via INTRAVENOUS

## 2017-01-13 MED ORDER — PHENYLEPHRINE 40 MCG/ML (10ML) SYRINGE FOR IV PUSH (FOR BLOOD PRESSURE SUPPORT)
PREFILLED_SYRINGE | INTRAVENOUS | Status: AC
  Filled 2017-01-13: qty 10

## 2017-01-13 MED ORDER — LOSARTAN POTASSIUM 50 MG PO TABS
50.0000 mg | ORAL_TABLET | Freq: Every day | ORAL | Status: DC
  Administered 2017-01-14: 50 mg via ORAL
  Filled 2017-01-13: qty 1

## 2017-01-13 MED ORDER — METOCLOPRAMIDE HCL 5 MG/ML IJ SOLN: 5.0000 mg | Freq: Three times a day (TID) | INTRAMUSCULAR | Status: DC | PRN

## 2017-01-13 MED ORDER — MIDAZOLAM HCL 2 MG/2ML IJ SOLN
INTRAMUSCULAR | Status: AC
  Filled 2017-01-13: qty 2

## 2017-01-13 MED ORDER — CHLORHEXIDINE GLUCONATE 4 % EX LIQD: 60.0000 mL | Freq: Once | CUTANEOUS | Status: DC

## 2017-01-13 MED ORDER — BUPIVACAINE HCL (PF) 0.25 % IJ SOLN
INTRAMUSCULAR | Status: AC
  Filled 2017-01-13: qty 30

## 2017-01-13 MED ORDER — FLEET ENEMA 7-19 GM/118ML RE ENEM: 1.0000 | ENEMA | Freq: Once | RECTAL | Status: DC | PRN

## 2017-01-13 MED ORDER — ONDANSETRON HCL 4 MG/2ML IJ SOLN: 4.0000 mg | Freq: Four times a day (QID) | INTRAMUSCULAR | Status: DC | PRN

## 2017-01-13 MED ORDER — STERILE WATER FOR IRRIGATION IR SOLN
Status: DC | PRN
  Administered 2017-01-13: 2000 mL

## 2017-01-13 MED ORDER — ACETAMINOPHEN 650 MG RE SUPP: 650.0000 mg | Freq: Four times a day (QID) | RECTAL | Status: DC | PRN

## 2017-01-13 MED ORDER — LEVOTHYROXINE SODIUM 75 MCG PO TABS
75.0000 ug | ORAL_TABLET | Freq: Every day | ORAL | Status: DC
  Administered 2017-01-14: 08:00:00 75 ug via ORAL
  Filled 2017-01-13: qty 1

## 2017-01-13 MED ORDER — MIDAZOLAM HCL 5 MG/5ML IJ SOLN
INTRAMUSCULAR | Status: DC | PRN
  Administered 2017-01-13: 2 mg via INTRAVENOUS

## 2017-01-13 MED ORDER — METHOCARBAMOL 500 MG PO TABS
500.0000 mg | ORAL_TABLET | Freq: Four times a day (QID) | ORAL | Status: DC | PRN
  Administered 2017-01-13 – 2017-01-14 (×2): 500 mg via ORAL
  Filled 2017-01-13 (×2): qty 1

## 2017-01-13 MED ORDER — ONDANSETRON HCL 4 MG/2ML IJ SOLN
INTRAMUSCULAR | Status: AC
  Filled 2017-01-13: qty 2

## 2017-01-13 MED ORDER — CEFAZOLIN SODIUM-DEXTROSE 2-4 GM/100ML-% IV SOLN
INTRAVENOUS | Status: AC
  Filled 2017-01-13: qty 100

## 2017-01-13 MED ORDER — DEXAMETHASONE SODIUM PHOSPHATE 10 MG/ML IJ SOLN
INTRAMUSCULAR | Status: AC
  Filled 2017-01-13: qty 1

## 2017-01-13 MED ORDER — BUPIVACAINE HCL (PF) 0.25 % IJ SOLN
INTRAMUSCULAR | Status: DC | PRN
  Administered 2017-01-13: 30 mL

## 2017-01-13 MED ORDER — PHENYLEPHRINE HCL 10 MG/ML IJ SOLN
INTRAMUSCULAR | Status: AC
  Filled 2017-01-13: qty 2

## 2017-01-13 MED ORDER — RIVAROXABAN 10 MG PO TABS
10.0000 mg | ORAL_TABLET | Freq: Every day | ORAL | Status: DC
  Administered 2017-01-14: 10 mg via ORAL
  Filled 2017-01-13: qty 1

## 2017-01-13 MED ORDER — DEXAMETHASONE SODIUM PHOSPHATE 10 MG/ML IJ SOLN
10.0000 mg | Freq: Once | INTRAMUSCULAR | Status: AC
  Administered 2017-01-14: 10 mg via INTRAVENOUS
  Filled 2017-01-13: qty 1

## 2017-01-13 MED ORDER — CEFAZOLIN SODIUM-DEXTROSE 2-4 GM/100ML-% IV SOLN
2.0000 g | INTRAVENOUS | Status: AC
  Administered 2017-01-13: 2 g via INTRAVENOUS

## 2017-01-13 MED ORDER — FENTANYL CITRATE (PF) 100 MCG/2ML IJ SOLN
INTRAMUSCULAR | Status: DC | PRN
  Administered 2017-01-13: 50 ug via INTRAVENOUS

## 2017-01-13 MED ORDER — ONDANSETRON HCL 4 MG PO TABS: 4.0000 mg | ORAL_TABLET | Freq: Four times a day (QID) | ORAL | Status: DC | PRN

## 2017-01-13 MED ORDER — CEFAZOLIN SODIUM-DEXTROSE 2-4 GM/100ML-% IV SOLN
2.0000 g | Freq: Four times a day (QID) | INTRAVENOUS | Status: AC
  Administered 2017-01-13 (×2): 2 g via INTRAVENOUS
  Filled 2017-01-13 (×2): qty 100

## 2017-01-13 MED ORDER — TRANEXAMIC ACID 1000 MG/10ML IV SOLN
INTRAVENOUS | Status: AC | PRN
  Administered 2017-01-13: 2000 mg via TOPICAL

## 2017-01-13 MED ORDER — ACETAMINOPHEN 10 MG/ML IV SOLN
INTRAVENOUS | Status: AC
  Filled 2017-01-13: qty 100

## 2017-01-13 MED ORDER — MENTHOL 3 MG MT LOZG: 1.0000 | LOZENGE | OROMUCOSAL | Status: DC | PRN

## 2017-01-13 MED ORDER — ACETAMINOPHEN 10 MG/ML IV SOLN
1000.0000 mg | Freq: Once | INTRAVENOUS | Status: AC
  Administered 2017-01-13: 1000 mg via INTRAVENOUS

## 2017-01-13 MED ORDER — MORPHINE SULFATE (PF) 4 MG/ML IV SOLN
1.0000 mg | INTRAVENOUS | Status: DC | PRN
  Administered 2017-01-13: 1 mg via INTRAVENOUS

## 2017-01-13 MED ORDER — TRANEXAMIC ACID 1000 MG/10ML IV SOLN
2000.0000 mg | Freq: Once | INTRAVENOUS | Status: DC
  Filled 2017-01-13: qty 20

## 2017-01-13 MED ORDER — SIMVASTATIN 20 MG PO TABS
20.0000 mg | ORAL_TABLET | Freq: Every evening | ORAL | Status: DC
  Administered 2017-01-13: 19:00:00 20 mg via ORAL
  Filled 2017-01-13: qty 1

## 2017-01-13 MED ORDER — PHENYLEPHRINE HCL 10 MG/ML IJ SOLN
INTRAMUSCULAR | Status: DC | PRN
  Administered 2017-01-13 (×3): 80 ug via INTRAVENOUS

## 2017-01-13 MED ORDER — PROPOFOL 500 MG/50ML IV EMUL
INTRAVENOUS | Status: DC | PRN
  Administered 2017-01-13: 75 ug/kg/min via INTRAVENOUS

## 2017-01-13 MED ORDER — BISACODYL 10 MG RE SUPP
10.0000 mg | Freq: Every day | RECTAL | Status: DC | PRN
Start: 2017-01-13 — End: 2017-01-14

## 2017-01-13 MED ORDER — HYDROMORPHONE HCL 2 MG PO TABS
2.0000 mg | ORAL_TABLET | ORAL | Status: DC | PRN
  Administered 2017-01-13: 23:00:00 4 mg via ORAL
  Administered 2017-01-13 (×2): 2 mg via ORAL
  Administered 2017-01-14 (×2): 4 mg via ORAL
  Filled 2017-01-13 (×3): qty 2
  Filled 2017-01-13: qty 1
  Filled 2017-01-13: qty 2
  Filled 2017-01-13: qty 1

## 2017-01-13 MED ORDER — ONDANSETRON HCL 4 MG/2ML IJ SOLN
4.0000 mg | Freq: Four times a day (QID) | INTRAMUSCULAR | Status: DC | PRN
  Administered 2017-01-13: 4 mg via INTRAVENOUS
  Filled 2017-01-13: qty 2

## 2017-01-13 MED ORDER — POLYETHYLENE GLYCOL 3350 17 G PO PACK: 17.0000 g | PACK | Freq: Every day | ORAL | Status: DC | PRN

## 2017-01-13 SURGICAL SUPPLY — 39 items
BAG DECANTER FOR FLEXI CONT (MISCELLANEOUS) ×3 IMPLANT
BLADE SAG 18X100X1.27 (BLADE) ×3 IMPLANT
CAPT HIP TOTAL 2 ×3 IMPLANT
CLOSURE WOUND 1/2 X4 (GAUZE/BANDAGES/DRESSINGS) ×1
CLOTH BEACON ORANGE TIMEOUT ST (SAFETY) ×3 IMPLANT
COVER PERINEAL POST (MISCELLANEOUS) ×3 IMPLANT
COVER SURGICAL LIGHT HANDLE (MISCELLANEOUS) ×3 IMPLANT
DERMABOND ADVANCED (GAUZE/BANDAGES/DRESSINGS) ×2
DERMABOND ADVANCED .7 DNX12 (GAUZE/BANDAGES/DRESSINGS) ×1 IMPLANT
DRAPE STERI IOBAN 125X83 (DRAPES) ×3 IMPLANT
DRAPE U-SHAPE 47X51 STRL (DRAPES) ×6 IMPLANT
DRSG ADAPTIC 3X8 NADH LF (GAUZE/BANDAGES/DRESSINGS) ×3 IMPLANT
DRSG MEPILEX BORDER 4X4 (GAUZE/BANDAGES/DRESSINGS) ×3 IMPLANT
DRSG MEPILEX BORDER 4X8 (GAUZE/BANDAGES/DRESSINGS) ×3 IMPLANT
DURAPREP 26ML APPLICATOR (WOUND CARE) ×3 IMPLANT
ELECT REM PT RETURN 15FT ADLT (MISCELLANEOUS) ×3 IMPLANT
EVACUATOR 1/8 PVC DRAIN (DRAIN) ×3 IMPLANT
GLOVE BIO SURGEON STRL SZ8 (GLOVE) ×6 IMPLANT
GLOVE BIOGEL PI IND STRL 7.0 (GLOVE) ×1 IMPLANT
GLOVE BIOGEL PI IND STRL 7.5 (GLOVE) ×3 IMPLANT
GLOVE BIOGEL PI IND STRL 8 (GLOVE) ×2 IMPLANT
GLOVE BIOGEL PI INDICATOR 7.0 (GLOVE) ×2
GLOVE BIOGEL PI INDICATOR 7.5 (GLOVE) ×6
GLOVE BIOGEL PI INDICATOR 8 (GLOVE) ×4
GLOVE ECLIPSE 8.0 STRL XLNG CF (GLOVE) ×6 IMPLANT
GLOVE SURG SS PI 7.0 STRL IVOR (GLOVE) ×6 IMPLANT
GOWN STRL REUS W/TWL LRG LVL3 (GOWN DISPOSABLE) ×3 IMPLANT
GOWN STRL REUS W/TWL XL LVL3 (GOWN DISPOSABLE) ×9 IMPLANT
PACK ANTERIOR HIP CUSTOM (KITS) ×3 IMPLANT
STRIP CLOSURE SKIN 1/2X4 (GAUZE/BANDAGES/DRESSINGS) ×2 IMPLANT
SUT ETHIBOND NAB CT1 #1 30IN (SUTURE) ×3 IMPLANT
SUT MNCRL AB 4-0 PS2 18 (SUTURE) ×3 IMPLANT
SUT STRATAFIX 0 PDS 27 VIOLET (SUTURE) ×3
SUT VIC AB 2-0 CT1 27 (SUTURE) ×4
SUT VIC AB 2-0 CT1 TAPERPNT 27 (SUTURE) ×2 IMPLANT
SUTURE STRATFX 0 PDS 27 VIOLET (SUTURE) ×1 IMPLANT
SYR 50ML LL SCALE MARK (SYRINGE) ×3 IMPLANT
TRAY FOLEY CATH SILVER 14FR (SET/KITS/TRAYS/PACK) ×3 IMPLANT
YANKAUER SUCT BULB TIP 10FT TU (MISCELLANEOUS) ×3 IMPLANT

## 2017-01-13 NOTE — Op Note (Signed)
OPERATIVE REPORT- TOTAL HIP ARTHROPLASTY   PREOPERATIVE DIAGNOSIS: Osteoarthritis of the Left hip.   POSTOPERATIVE DIAGNOSIS: Osteoarthritis of the Left  hip.   PROCEDURE: Left total hip arthroplasty, anterior approach.   SURGEON: Gaynelle Arabian, MD   ASSISTANT: Molli Barrows, PA-C  ANESTHESIA:  Spinal  ESTIMATED BLOOD LOSS:-500 ml    DRAINS: Hemovac x1.   COMPLICATIONS: None   CONDITION: PACU - hemodynamically stable.   BRIEF CLINICAL NOTE: Nichole Pierce is a 71 y.o. female who has advanced end-  stage arthritis of their Left  hip with progressively worsening pain and  dysfunction.The patient has failed nonoperative management and presents for  total hip arthroplasty.   PROCEDURE IN DETAIL: After successful administration of spinal  anesthetic, the traction boots for the [facility] bed were placed on both  feet and the patient was placed onto the [facility] bed, boots placed into the leg  holders. The Left hip was then isolated from the perineum with plastic  drapes and prepped and draped in the usual sterile fashion. ASIS and  greater trochanter were marked and a oblique incision was made, starting  at about 1 cm lateral and 2 cm distal to the ASIS and coursing towards  the anterior cortex of the femur. The skin was cut with a 10 blade  through subcutaneous tissue to the level of the fascia overlying the  tensor fascia lata muscle. The fascia was then incised in line with the  incision at the junction of the anterior third and posterior 2/3rd. The  muscle was teased off the fascia and then the interval between the TFL  and the rectus was developed. The Hohmann retractor was then placed at  the top of the femoral neck over the capsule. The vessels overlying the  capsule were cauterized and the fat on top of the capsule was removed.  A Hohmann retractor was then placed anterior underneath the rectus  femoris to give exposure to the entire anterior capsule. A T-shaped   capsulotomy was performed. The edges were tagged and the femoral head  was identified.       Osteophytes are removed off the superior acetabulum.  The femoral neck was then cut in situ with an oscillating saw. Traction  was then applied to the left lower extremity utilizing the [facility]  traction. The femoral head was then removed. Retractors were placed  around the acetabulum and then circumferential removal of the labrum was  performed. Osteophytes were also removed. Reaming starts at 45 mm to  medialize and  Increased in 2 mm increments to 49 mm. We reamed in  approximately 40 degrees of abduction, 20 degrees anteversion. A 50 mm  pinnacle acetabular shell was then impacted in anatomic position under  fluoroscopic guidance with excellent purchase. We did not need to place  any additional dome screws. A 32 mm neutral + 4 marathon liner was then  placed into the acetabular shell.       The femoral lift was then placed along the lateral aspect of the femur  just distal to the vastus ridge. The leg was  externally rotated and capsule  was stripped off the inferior aspect of the femoral neck down to the  level of the lesser trochanter, this was done with electrocautery. The femur was lifted after this was performed. The  leg was then placed in an extended and adducted position essentially delivering the femur. We also removed the capsule superiorly and the piriformis from the piriformis fossa  to gain excellent exposure of the  proximal femur. Rongeur was used to remove some cancellous bone to get  into the lateral portion of the proximal femur for placement of the  initial starter reamer. The starter broaches was placed  the starter broach  and was shown to go down the center of the canal. Broaching  with the  Corail system was then performed starting at size 8, coursing  Up to size 14. A size 14 had excellent torsional and rotational  and axial stability. The trial high offset neck was then  placed  with a 32 + 1 trial head. The hip was then reduced. We confirmed that  the stem was in the canal both on AP and lateral x-rays. It also has excellent sizing. The hip was reduced with outstanding stability through full extension and full external rotation.. AP pelvis was taken and the leg lengths were measured and found to be equal. Hip was then dislocated again and the femoral head and neck removed. The  femoral broach was removed. Size 14 Cor[place] stem with a high offset  neck was then impacted into the femur following native anteversion. Has  excellent purchase in the canal. Excellent torsional and rotational and  axial stability. It is confirmed to be in the canal on AP and lateral  fluoroscopic views. The 32 + 1 ceramic head was placed and the hip  reduced with outstanding stability. Again AP pelvis was taken and it  confirmed that the leg lengths were equal. The wound was then copiously  irrigated with saline solution and the capsule reattached and repaired  with Ethibond suture. 30 ml of .25% Bupivicaine was  injected into the capsule and into the edge of the tensor fascia lata as well as subcutaneous tissue. The fascia overlying the tensor fascia lata was then closed with a running #1 V-Loc. Subcu was closed with interrupted 2-0 Vicryl and subcuticular running 4-0 Monocryl. Incision was cleaned  and dried. Steri-Strips and a bulky sterile dressing applied. Hemovac  drain was hooked to suction and then the patient was awakened and transported to  recovery in stable condition.        Please note that a surgical assistant was a medical necessity for this procedure to perform it in a safe and expeditious manner. Assistant was necessary to provide appropriate retraction of vital neurovascular structures and to prevent femoral fracture and allow for anatomic placement of the prosthesis.  Gaynelle Arabian, M.D.

## 2017-01-13 NOTE — Anesthesia Procedure Notes (Signed)
Performed by: [place] Valeria Boza C Oxygen Delivery Method: Simple face mask

## 2017-01-13 NOTE — Anesthesia Preprocedure Evaluation (Signed)
Anesthesia Evaluation  Patient identified by MRN, date of birth, ID band Patient awake    Reviewed: Allergy & Precautions, H&P , NPO status , Patient's Chart, lab work & pertinent test results  Airway Mallampati: II   Neck ROM: full    Dental   Pulmonary neg pulmonary ROS,    breath sounds clear to auscultation       Cardiovascular hypertension,  Rhythm:regular Rate:Normal     Neuro/Psych    GI/Hepatic   Endo/Other    Renal/GU      Musculoskeletal  (+) Arthritis ,   Abdominal   Peds  Hematology   Anesthesia Other Findings   Reproductive/Obstetrics                             Anesthesia Physical Anesthesia Plan  ASA: II  Anesthesia Plan: Spinal   Post-op Pain Management:    Induction: Intravenous  PONV Risk Score and Plan: 2 and Ondansetron, Dexamethasone and Treatment may vary due to age or medical condition  Airway Management Planned: Simple Face Mask  Additional Equipment:   Intra-op Plan:   Post-operative Plan:   Informed Consent: I have reviewed the patients History and Physical, chart, labs and discussed the procedure including the risks, benefits and alternatives for the proposed anesthesia with the patient or authorized representative who has indicated his/her understanding and acceptance.     Plan Discussed with: CRNA, Anesthesiologist and Surgeon  Anesthesia Plan Comments:         Anesthesia Quick Evaluation

## 2017-01-13 NOTE — Interval H&P Note (Signed)
History and Physical Interval Note:  01/13/2017 9:33 AM  Nichole Pierce  has presented today for surgery, with the diagnosis of Osteoarthritis Left Hip  The various methods of treatment have been discussed with the patient and family. After consideration of risks, benefits and other options for treatment, the patient has consented to  Procedure(s): LEFT TOTAL HIP ARTHROPLASTY ANTERIOR APPROACH (Left) as a surgical intervention .  The patient's history has been reviewed, patient examined, no change in status, stable for surgery.  I have reviewed the patient's chart and labs.  Questions were answered to the patient's satisfaction.     Gearlean Alf

## 2017-01-13 NOTE — Anesthesia Postprocedure Evaluation (Signed)
Anesthesia Post Note  Patient: Nichole Pierce  Procedure(s) Performed: Procedure(s) (LRB): LEFT TOTAL HIP ARTHROPLASTY ANTERIOR APPROACH (Left)     Patient location during evaluation: PACU Anesthesia Type: Spinal Level of consciousness: oriented and awake and alert Pain management: pain level controlled Vital Signs Assessment: post-procedure vital signs reviewed and stable Respiratory status: spontaneous breathing, respiratory function stable and patient connected to nasal cannula oxygen Cardiovascular status: blood pressure returned to baseline and stable Postop Assessment: no headache and no backache Anesthetic complications: no    Last Vitals:  Vitals:   01/13/17 0839 01/13/17 1230  BP: (!) 164/92 124/81  Pulse: 75 78  Resp: 18 17  Temp: 36.9 C (!) 36.4 C    Last Pain:  Vitals:   01/13/17 1230  TempSrc:   PainSc: [place]

## 2017-01-13 NOTE — Anesthesia Procedure Notes (Signed)
Spinal  Patient location during procedure: OR Start time: 01/13/2017 10:45 AM Staffing Anesthesiologist: Marcie Bal, ADAM Resident/CRNA: [place] Performed: resident/CRNA  Preanesthetic Checklist Completed: patient identified, site marked, surgical consent, pre-op evaluation, timeout performed, IV checked, risks and benefits discussed and monitors and equipment checked Spinal Block Patient position: sitting Prep: Betadine Patient monitoring: heart rate, continuous pulse ox and blood pressure Location: L3-4 Injection technique: single-shot Needle Needle type: Pencan  Needle gauge: 24 G Needle length: 9 cm Assessment Sensory level: T6 Additional Notes Expiration date of kit checked and confirmed. Patient tolerated procedure well, without complications.

## 2017-01-13 NOTE — Transfer of Care (Signed)
Immediate Anesthesia Transfer of Care Note  Patient: Nichole Pierce  Procedure(s) Performed: Procedure(s): LEFT TOTAL HIP ARTHROPLASTY ANTERIOR APPROACH (Left)  Patient Location: PACU  Anesthesia Type:Spinal  Level of Consciousness: awake, drowsy and responds to stimulation  Airway & Oxygen Therapy: Patient Spontanous Breathing and Patient connected to face mask oxygen  Post-op Assessment: Report given to RN and Post -op Vital signs reviewed and stable  Post vital signs: Reviewed and stable  Last Vitals:  Vitals:   01/13/17 0839  BP: (!) 164/92  Pulse: 75  Resp: 18  Temp: 36.9 C    Last Pain:  Vitals:   01/13/17 0839  TempSrc: Oral      Patients Stated Pain Goal: 4 (56/86/16 8372)  Complications: No apparent anesthesia complications

## 2017-01-13 NOTE — H&P (View-Only) (Signed)
Nichole Pierce  DOB: [birth year] Married / Language: English / Race: White Female  Date of Admission:  01/13/2017  CC:  Left hip pain History of Present Illness  The patient is a 71 year old female who comes in  for a preoperative History and Physical. The patient is scheduled for a left total hip arthroplasty (anterior) to be performed by Dr. Dione Plover. Aluisio, MD at [facility] on 01/13/2017. The patient is a 71 year old female who comes in s/p out from right total hip arthroplasty. The patient states that she is doing very well at this time. The pain is under excellent control at this time and describes their pain as none. They are currently on no medication for their pain. The patient is currently doing home exercise program. she has gone back to ballroom dancing. The patient feels that they are progressing well at this time. She would like to get scheduled for left THA. She said her right hip is doing fantastic at this time. She is really pleased with it. The only thing holding her back now is her left hip. She does have documented bone on bone arthritis on the left. She feels like she is ready to proceed with the other side. Left hip is very symptomatic and she wants to get that fixed. She does have bone-on-bone arthritis with significant pain and dysfunction, so I do believe that total hip arthroplasty would be the best means for getting her better. They have been treated conservatively in the past for the above stated problem and despite conservative measures, they continue to have progressive pain and severe functional limitations and dysfunction. They have failed non-operative management including home exercise, medications, and exercises. It is felt that they would benefit from undergoing total joint replacement. Risks and benefits of the procedure have been discussed with the patient and they elect to proceed with surgery. There are no active contraindications to surgery such as  ongoing infection or rapidly progressive neurological disease.    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 Lisinopril *ANTIHYPERTENSIVES*  Cough.  Family History Cancer  father Heart Disease  grandmother mothers side Liver Disease, Chronic  grandfather mothers side Severe allergy  child Father  Deceased. age 36, Colon Cancer Mother  Deceased. age 67, Cardic Arrest  Social History Alcohol use  never consumed alcohol Current work status  working part time Drug/Alcohol Rehab (Currently)  no Drug/Alcohol Rehab (Previously)  no Exercise  Exercises daily; does other Illicit drug use  no Living situation  live with spouse Marital status  married Number of flights of stairs before winded  2-3 Pain Contract  no Tobacco / smoke exposure  no Tobacco use  never smoker  Medication History  Prolia Injections Active. Aleve Active. Ibuprofen Active. Multivitaimin Active. Co-Q-10 Active. Krill Oil Active. Ascorbic Acid (1000MG  Tablet, Oral) Active. Denosumab (60MG /ML Solution, Subcutaneous) Active. Omega-3 (180-270MG  Capsule, Oral) Active. Ubiquinol (100MG  Capsule, Oral) Active. Calcium (500MG  Tablet, Oral) Active. Cholecalciferol (1000UNIT Capsule, Oral) Active. Estradiol (0.025MG /24HR Patch TW, Transdermal) Active. Levothyroxine Sodium (75MCG Tablet, Oral) Active. Simvastatin (20MG  Tablet, Oral) Active. Losartan Potassium (50MG  Tablet, Oral) Active. Aspirin (81MG  Tablet Chewable, 1 (one) Oral) Active.  Past Surgical History  Colon Polyp Removal - Colonoscopy  Total Hip Replacement - Right [05/13/2016]: Hysterectomy   complete (cancerous) Thyroidectomy; Subtotal  right Tonsillectomy  ORIF Left Patella  Salivary Gland Surgery  Cataract Extraction-Bilateral   Review of Systems  General Not Present- Chills, Fatigue, Fever, Memory Loss, Night Sweats, Weight Gain and Weight Loss. Skin Not Present- Eczema, Hives, Itching, Lesions and Rash. HEENT Not Present- Dentures, Double Vision, Headache, Hearing Loss, Tinnitus and Visual Loss. Respiratory Not Present- Allergies, Chronic Cough, Coughing up blood, Shortness of breath at rest and Shortness of breath with exertion. Cardiovascular Not Present- Chest Pain, Difficulty Breathing Lying Down, Murmur, Palpitations, Racing/skipping heartbeats and Swelling. Gastrointestinal Not Present- Abdominal Pain, Bloody Stool, Constipation, Diarrhea, Difficulty Swallowing, Heartburn, Jaundice, Loss of appetitie, Nausea and Vomiting. Female Genitourinary Not Present- Blood in Urine, Discharge, Flank Pain, Incontinence, Painful Urination, Urgency, Urinary frequency, Urinary Retention, Urinating at Night and Weak urinary stream. Musculoskeletal Not Present- Back Pain, Joint Pain, Joint Swelling, Morning Stiffness, Muscle Pain, Muscle Weakness and Spasms. Neurological Not Present- Blackout spells, Difficulty with balance, Dizziness, Paralysis, Tremor and Weakness. Psychiatric Not Present- Insomnia.  Vitals  Weight: 155 lb Height: 65.5in Weight was reported by patient. Height was reported by patient. Body Surface Area: 1.78 m Body Mass Index: 25.4 kg/m  Pulse: 72 (Regular)  BP: 122/78 (Sitting, Right Arm, Standard)       Physical Exam General Mental Status -Alert, cooperative and good historian. General Appearance-pleasant, Not in acute distress. Orientation-Oriented X3. Build & Nutrition-Well nourished and Well developed.  Head and Neck Head-normocephalic, atraumatic . Neck Global Assessment - supple, no bruit auscultated on the right, no  bruit auscultated on the left.  Eye Pupil - Bilateral-Regular and Round. Motion - Bilateral-EOMI.  Chest and Lung Exam Auscultation Breath sounds - clear at anterior chest wall and clear at posterior chest wall. Adventitious sounds - No Adventitious sounds.  Cardiovascular Auscultation Rhythm - Regular rate and rhythm. Heart Sounds - S1 WNL and S2 WNL. Murmurs & Other Heart Sounds: Murmur 1 - Location - Aortic Area. Timing - Early systolic. Grade - II/VI.  Abdomen Palpation/Percussion Tenderness - Abdomen is non-tender to palpation. Rigidity (guarding) - Abdomen is soft. Auscultation Auscultation of the abdomen reveals - Bowel sounds normal.  Female Genitourinary Note: Not done, not pertinent to present illness   Musculoskeletal Note: Left hip flexion about 100, minimal internal rotation about 20 degrees abduction, 20 external rotation.  RADIOGRAPHS She has bone on bone arthritis throughout the left hip with subchondral cystic formation, osteophyte formation.     Assessment & Plan Primary osteoarthritis of left hip (M16.12) Status post total hip replacement, right (K08.811)  Note:Surgical Plans: Left Total Hip Replacement - Anterior Approach  Disposition: Home with HHPT  PCP: Dr. Drinda Butts  Topical TXA  Anesthesia Issues: None except for nausea  Patient was instructed on what medications to stop prior to surgery.  Signed electronically by Joelene Millin, III PA-C

## 2017-01-14 LAB — CBC
HEMATOCRIT: 34.9 % — AB (ref 36.0–46.0)
HEMOGLOBIN: 11.6 g/dL — AB (ref 12.0–15.0)
MCH: 29.4 pg (ref 26.0–34.0)
MCHC: 33.2 g/dL (ref 30.0–36.0)
MCV: 88.4 fL (ref 78.0–100.0)
Platelets: 226 10*3/uL (ref 150–400)
RBC: 3.95 MIL/uL (ref 3.87–5.11)
RDW: 14.3 % (ref 11.5–15.5)
WBC: 18.2 10*3/uL — ABNORMAL HIGH (ref 4.0–10.5)

## 2017-01-14 LAB — BASIC METABOLIC PANEL
ANION GAP: 8 (ref 5–15)
BUN: 15 mg/dL (ref 6–20)
CHLORIDE: 103 mmol/L (ref 101–111)
CO2: 26 mmol/L (ref 22–32)
Calcium: 8.3 mg/dL — ABNORMAL LOW (ref 8.9–10.3)
Creatinine, Ser: 0.74 mg/dL (ref 0.44–1.00)
GFR calc non Af Amer: >60 mL/min (ref >60)
Glucose, Bld: 143 mg/dL — ABNORMAL HIGH (ref 65–99)
POTASSIUM: 4 mmol/L (ref 3.5–5.1)
SODIUM: 137 mmol/L (ref 135–145)

## 2017-01-14 MED ORDER — HYDROMORPHONE HCL 2 MG PO TABS: 2.0000 mg | ORAL_TABLET | ORAL | 0 refills | Status: AC | PRN

## 2017-01-14 MED ORDER — TRAMADOL HCL 50 MG PO TABS: 50.0000 mg | ORAL_TABLET | Freq: Four times a day (QID) | ORAL | 0 refills | Status: AC | PRN

## 2017-01-14 MED ORDER — RIVAROXABAN 10 MG PO TABS
10.0000 mg | ORAL_TABLET | Freq: Every day | ORAL | 0 refills | Status: AC
Start: 2017-01-14 — End: ?

## 2017-01-14 MED ORDER — TIZANIDINE HCL 4 MG PO TABS: 4.0000 mg | ORAL_TABLET | Freq: Three times a day (TID) | ORAL | 0 refills | Status: AC | PRN

## 2017-01-14 NOTE — Progress Notes (Signed)
Physical Therapy Evaluation Patient Details Name: Nichole Pierce MRN: [id number] DOB: [birth year] Today's Date: 01/14/2017   History of Present Illness  Pt s/p L THR performed with an anterior approach.  Pt s/p R THR 11/18   Clinical Impression  Pt is s/p L THR surgery resulting in functional limitations due to the deficits listed below. Pt will benefit from skilled PT to increase their independence and safety with mobility to allow for discharge. Pt tolerated treatment session well. Pt able to ambulate and perform exercises with no reported change in pain. Continue to progress exercises and teach stair mobility during next therapy session.     Follow Up Recommendations DC plan and follow up therapy as arranged by surgeon    Equipment Recommendations  None recommended by PT    Recommendations for Other Services       Precautions / Restrictions Precautions Precautions: Fall Restrictions Weight Bearing Restrictions: No Other Position/Activity Restrictions: WBAT      Mobility  Bed Mobility Overal bed mobility: Needs Assistance Bed Mobility: Supine to Sit;Sit to Supine     Supine to sit: Min assist Sit to supine: Min assist   General bed mobility comments: Pt required cues for LE management and support for stabilization   Transfers Overall transfer level: Needs assistance Equipment used: Rolling walker (2 wheeled) Transfers: Sit to/from Stand Sit to Stand: Min assist         General transfer comment: Pt required cues for hand placement on RW and bed for self assist as well as LE management  Ambulation/Gait Ambulation/Gait assistance: Min guard Ambulation Distance (Feet): 100 Feet Assistive device: Rolling walker (2 wheeled) Gait Pattern/deviations: Step-through pattern;Decreased step length - right;Decreased step length - left;Antalgic;Shuffle Gait velocity: decr Gait velocity interpretation: Below normal speed for age/gender General Gait Details: Pt demonstrates  difficulty with hip flexion during ambulation and decreased step length, pt reports pain remains constant and equal to levels prior to ambulation  Stairs            Wheelchair Mobility    Modified Rankin (Stroke Patients Only)       Balance Overall balance assessment: Needs assistance Sitting-balance support: Feet supported Sitting balance-Leahy Scale: Good     Standing balance support: Bilateral upper extremity supported Standing balance-Leahy Scale: Poor Standing balance comment: Pt requires RW for bilat UE support                              Pertinent Vitals/Pain Pain Assessment: 0-10 Pain Score: 3  Pain Location: L hip Pain Descriptors / Indicators: Sore;Aching Pain Intervention(s): Monitored during session;Limited activity within patient's tolerance;Repositioned;Ice applied;Premedicated before session    [place] expects to be discharged to:: Private residence Living Arrangements: Spouse/significant other Available Help at Discharge: Family Type of Home: House Home Access: Stairs to enter Entrance Stairs-Rails: Psychiatric nurse of Steps: 2 Home Layout: One [place]: Environmental consultant - 2 wheels;Walker - standard;Bedside commode      Prior Function Level of Independence: Independent               Hand Dominance        Extremity/Trunk Assessment   Upper Extremity Assessment Upper Extremity Assessment: Overall WFL for tasks assessed    Lower Extremity Assessment Lower Extremity Assessment: LLE deficits/detail LLE Deficits / Details: Pt right hip flexion and abduction 2/5 MMT, AAROM 20 degrees abduction and 30 degrees hip flexion LLE: Unable to fully assess due to pain  Communication   Communication: No difficulties  Cognition Arousal/Alertness: Awake/alert Behavior During Therapy: WFL for tasks assessed/performed Overall Cognitive Status: Within Functional Limits for tasks assessed                                         General Comments      Exercises Total Joint Exercises Ankle Circles/Pumps: 10 reps;Supine;Both;AROM Quad Sets: 10 reps;Both;Supine;AROM Heel Slides: 10 reps;Left;AAROM Hip ABduction/ADduction: AAROM;Left;10 reps   Assessment/Plan    PT Assessment Patient needs continued PT services  PT Problem List Decreased strength;Decreased mobility;Decreased range of motion;Decreased activity tolerance;Decreased balance;Decreased knowledge of use of DME;Decreased knowledge of precautions;Pain       PT Treatment Interventions DME instruction;Therapeutic activities;Gait training;Therapeutic exercise;Patient/family education;Stair training;Balance training;Functional mobility training    PT Goals (Current goals can be found in the Care Plan section)  Acute Rehab PT Goals Patient Stated Goal: Pt would like to return home  PT Goal Formulation: With patient Time For Goal Achievement: 01/19/17 Potential to Achieve Goals: Good    Frequency 7X/week   Barriers to discharge        Co-evaluation               AM-PAC PT "6 Clicks" Daily Activity  Outcome Measure Difficulty turning over in bed (including adjusting bedclothes, sheets and blankets)?: Total Difficulty moving from lying on back to sitting on the side of the bed? : Total Difficulty sitting down on and standing up from a chair with arms (e.g., wheelchair, bedside commode, etc,.)?: Total Help needed moving to and from a bed to chair (including a wheelchair)?: A Little Help needed walking in hospital room?: A Little Help needed climbing 3-5 steps with a railing? : A Lot 6 Click Score: 11    End of Session Equipment Utilized During Treatment: Gait belt Activity Tolerance: Patient tolerated treatment well Patient left: in chair;with family/visitor present;with call bell/phone within reach Nurse Communication: Mobility status PT Visit Diagnosis: Difficulty in walking, not  elsewhere classified (R26.2)    Time: 3875-6433 PT Time Calculation (min) (ACUTE ONLY): 28 min   Charges:   PT Evaluation $PT Eval Low Complexity: 1 Procedure PT Treatments $Therapeutic Exercise: 8-22 mins   PT G Codes:        Olegario Shearer, SPT  Reino Bellis 01/14/2017, 1:29 PM

## 2017-01-14 NOTE — Progress Notes (Signed)
Physical Therapy Treatment Patient Details Name: Nichole Pierce MRN: [id number] DOB: 01-29-1946 Today's Date: 01/14/2017    History of Present Illness Pt s/p L THR performed with an anterior approach.  Pt s/p R THR 11/18     PT Comments    Pt tolerated treatment session well. Pt was instructed on proper stair mobility including sequencing and use of cane. Pt also reviewed HEP and feels comfortable and confident to continue performing HEP at home with support from spouse. Pt was also instructed on proper technique for car transport.   Follow Up Recommendations  DC plan and follow up therapy as arranged by surgeon     Equipment Recommendations  None recommended by PT    Recommendations for Other Services       Precautions / Restrictions Precautions Precautions: Fall Restrictions Weight Bearing Restrictions: No Other Position/Activity Restrictions: WBAT    Mobility  Bed Mobility Overal bed mobility:  (Pt OOB when PT arrived) Bed Mobility: Supine to Sit;Sit to Supine     Supine to sit: Min assist Sit to supine: Min assist   General bed mobility comments: Pt required cues for LE management and support for stabilization   Transfers Overall transfer level: Needs assistance Equipment used: Rolling walker (2 wheeled) Transfers: Sit to/from Stand Sit to Stand: Min guard         General transfer comment: Pt given cues to reinforce proper technique on self assist and for LE management   Ambulation/Gait Ambulation/Gait assistance: Min guard Ambulation Distance (Feet): 100 Feet Assistive device: Rolling walker (2 wheeled) Gait Pattern/deviations: Step-through pattern;Decreased step length - right;Decreased step length - left;Antalgic;Shuffle Gait velocity: decr Gait velocity interpretation: Below normal speed for age/gender General Gait Details: Pt demonstrates difficulty with hip flexion during ambulation and decreased step length, pt reports pain remains constant and  equal to levels prior to ambulation   Stairs Stairs: Yes   Stair Management: One rail Left;Forwards;With cane Number of Stairs: 2 General stair comments: Pt tolerated stair mobility well after several repetitions, pt given cues for proper sequencing and use of cane while ascending and descending stairs  Wheelchair Mobility    Modified Rankin (Stroke Patients Only)       Balance Overall balance assessment: Needs assistance Sitting-balance support: Feet supported Sitting balance-Leahy Scale: Good     Standing balance support: Bilateral upper extremity supported Standing balance-Leahy Scale: Fair Standing balance comment: Pt requires RW for bilat UE support                             Cognition Arousal/Alertness: Awake/alert Behavior During Therapy: WFL for tasks assessed/performed Overall Cognitive Status: Within Functional Limits for tasks assessed                                        Exercises Total Joint Exercises Ankle Circles/Pumps: 10 reps;Supine;Both;AROM Quad Sets: 10 reps;Both;Supine;AROM Heel Slides: 10 reps;Left;AAROM;Supine Hip ABduction/ADduction: AAROM;Left;10 reps;Supine Long Arc Quad: 10 reps;AROM;Seated    General Comments        Pertinent Vitals/Pain Pain Assessment: 0-10 Pain Score: 2  Pain Location: L hip Pain Descriptors / Indicators: Sore;Aching Pain Intervention(s): Ice applied;Monitored during session;Limited activity within patient's tolerance;Repositioned;Premedicated before session    Home Living Family/patient expects to be discharged to:: Private residence Living Arrangements: Spouse/significant other Available Help at Discharge: Family Type of Home: House Home Access: Stairs to  enter Entrance Stairs-Rails: Right;Left Home Layout: One level Home Equipment: [place] - 2 wheels;Walker - standard;Bedside commode      Prior Function Level of Independence: Independent          PT Goals (current  goals can now be found in the care plan section) Acute Rehab PT Goals Patient Stated Goal: Pt would like to return home  PT Goal Formulation: With patient Time For Goal Achievement: 01/19/17 Potential to Achieve Goals: Good Progress towards PT goals: Progressing toward goals    Frequency    7X/week      PT Plan Current plan remains appropriate    Co-evaluation              AM-PAC PT "6 Clicks" Daily Activity  Outcome Measure  Difficulty turning over in bed (including adjusting bedclothes, sheets and blankets)?: A Little Difficulty moving from lying on back to sitting on the side of the bed? : A Little Difficulty sitting down on and standing up from a chair with arms (e.g., wheelchair, bedside commode, etc,.)?: A Lot Help needed moving to and from a bed to chair (including a wheelchair)?: A Little Help needed walking in hospital room?: A Little Help needed climbing 3-5 steps with a railing? : A Little 6 Click Score: 17    End of Session Equipment Utilized During Treatment: Gait belt Activity Tolerance: Patient tolerated treatment well Patient left: in chair;with family/visitor present;with call bell/phone within reach Nurse Communication: Mobility status PT Visit Diagnosis: Difficulty in walking, not elsewhere classified (R26.2)     Time: 5643-3295 PT Time Calculation (min) (ACUTE ONLY): 28 min  Charges:  $Therapeutic Exercise: 8-22 mins                    G Codes:       Olegario Shearer, SPT   Reino Bellis 01/14/2017, 3:29 PM

## 2017-01-14 NOTE — Progress Notes (Signed)
   Subjective: 1 Day Post-Op Procedure(s) (LRB): LEFT TOTAL HIP ARTHROPLASTY ANTERIOR APPROACH (Left) Patient reports pain as mild.   Patient seen in rounds with Dr. Wynelle Link. Patient is well, and has had no acute complaints or problems other than some mild discomfort in the left thigh. No issues overnight. No SOB or chest pain.    Objective: Vital signs in last 24 hours: Temp:  [97.4 F (36.3 C)-98.4 F (36.9 C)] 97.8 F (36.6 C) (07/19 0606) Pulse Rate:  [51-78] 75 (07/19 0606) Resp:  [13-19] 16 (07/19 0606) BP: (122-164)/(69-92) 136/71 (07/19 0606) SpO2:  [99 %-100 %] 100 % (07/19 0606) Weight:  [71.7 kg (158 lb)] 71.7 kg (158 lb) (07/18 0840)  Intake/Output from previous day:  Intake/Output Summary (Last 24 hours) at 01/14/17 0800 Last data filed at 01/14/17 0625  Gross per 24 hour  Intake          3329.93 ml  Output             2930 ml  Net           399.93 ml    Labs:  Recent Labs  01/14/17 0509  HGB 11.6*    Recent Labs  01/14/17 0509  WBC 18.2*  RBC 3.95  HCT 34.9*  PLT 226    Recent Labs  01/14/17 0509  NA 137  K 4.0  CL 103  CO2 26  BUN 15  CREATININE 0.74  GLUCOSE 143*  CALCIUM 8.3*    EXAM General - Patient is Alert and Oriented Extremity - Neurologically intact Intact pulses distally Dorsiflexion/Plantar flexion intact No cellulitis present Compartment soft Dressing - dressing C/D/I Motor Function - intact, moving foot and toes well on exam.  Hemovac pulled without difficulty.  Past Medical History:  Diagnosis Date  . Arthritis   . Cancer [facility])    uterine cancer ; hysterectomy performed   . Hypertension   . Thyroid disease     Assessment/Plan: 1 Day Post-Op Procedure(s) (LRB): LEFT TOTAL HIP ARTHROPLASTY ANTERIOR APPROACH (Left) Active Problems:   OA (osteoarthritis) of hip  Estimated body mass index is 26.29 kg/m as calculated from the following:   Height as of this encounter: 5\' 5"  (1.651 m).   Weight as of this  encounter: 71.7 kg (158 lb). Advance diet Up with therapy D/C IV fluids when tolerating POs well  DVT Prophylaxis - Xarelto Weight Bearing As Tolerated   Doing well this morning. Plan for DC home today as long as she progresses well with therapy.   Ardeen Jourdain, PA-C Orthopaedic Surgery 01/14/2017, 8:00 AM

## 2017-01-14 NOTE — Discharge Instructions (Signed)
INSTRUCTIONS AFTER JOINT REPLACEMENT  ° °o Remove items at home which could result in a fall. This includes throw rugs or furniture in walking pathways °o ICE to the affected joint every three hours while awake for 30 minutes at a time, for at least the first 3-5 days, and then as needed for pain and swelling.  Continue to use ice for pain and swelling. You may notice swelling that will progress down to the foot and ankle.  This is normal after surgery.  Elevate your leg when you are not up walking on it.   °o Continue to use the breathing machine you got in the hospital (incentive spirometer) which will help keep your temperature down.  It is common for your temperature to cycle up and down following surgery, especially at night when you are not up moving around and exerting yourself.  The breathing machine keeps your lungs expanded and your temperature down. ° ° °DIET:  As you were doing prior to hospitalization, we recommend a well-balanced diet. ° °DRESSING / WOUND CARE / SHOWERING ° °You may change your dressing every day with sterile gauze.  Please use good hand washing techniques before changing the dressing.  Do not use any lotions or creams on the incision until instructed by your surgeon. ° °ACTIVITY ° °o Increase activity slowly as tolerated, but follow the weight bearing instructions below.   °o No driving for 6 weeks or until further direction given by your physician.  You cannot drive while taking narcotics.  °o No lifting or carrying greater than 10 lbs. until further directed by your surgeon. °o Avoid periods of inactivity such as sitting longer than an hour when not asleep. This helps prevent blood clots.  °o You may return to work once you are authorized by your doctor.  ° ° ° °WEIGHT BEARING  ° °Weight bearing as tolerated with assist device (walker, cane, etc) as directed, use it as long as suggested by your surgeon or therapist, typically at least 4-6 weeks. ° ° °EXERCISES ° °Results after joint  replacement surgery are often greatly improved when you follow the exercise, range of motion and muscle strengthening exercises prescribed by your doctor. Safety measures are also important to protect the joint from further injury. Any time any of these exercises cause you to have increased pain or swelling, decrease what you are doing until you are comfortable again and then slowly increase them. If you have problems or questions, call your caregiver or physical therapist for advice.  ° °Rehabilitation is important following a joint replacement. After just a few days of immobilization, the muscles of the leg can become weakened and shrink (atrophy).  These exercises are designed to build up the tone and strength of the thigh and leg muscles and to improve motion. Often times heat used for twenty to thirty minutes before working out will loosen up your tissues and help with improving the range of motion but do not use heat for the first two weeks following surgery (sometimes heat can increase post-operative swelling).  ° °These exercises can be done on a training (exercise) mat, on the floor, on a table or on a bed. Use whatever works the best and is most comfortable for you.    Use music or television while you are exercising so that the exercises are a pleasant break in your day. This will make your life better with the exercises acting as a break in your routine that you can look forward to.     Perform all exercises about fifteen times, three times per day or as directed.  You should exercise both the operative leg and the other leg as well.  Exercises include:    Quad Sets - Tighten up the muscle on the front of the thigh (Quad) and hold for 5-10 seconds.    Straight Leg Raises - With your knee straight (if you were given a brace, keep it on), lift the leg to 60 degrees, hold for 3 seconds, and slowly lower the leg.  Perform this exercise against resistance later as your leg gets stronger.   Leg Slides:  Lying on your back, slowly slide your foot toward your buttocks, bending your knee up off the floor (only go as far as is comfortable). Then slowly slide your foot back down until your leg is flat on the floor again.   Angel Wings: Lying on your back spread your legs to the side as far apart as you can without causing discomfort.   Hamstring Strength:  Lying on your back, push your heel against the floor with your leg straight by tightening up the muscles of your buttocks.  Repeat, but this time bend your knee to a comfortable angle, and push your heel against the floor.  You may put a pillow under the heel to make it more comfortable if necessary.   A rehabilitation program following joint replacement surgery can speed recovery and prevent re-injury in the future due to weakened muscles. Contact your doctor or a physical therapist for more information on knee rehabilitation.    CONSTIPATION  Constipation is defined medically as fewer than three stools per week and severe constipation as less than one stool per week.  Even if you have a regular bowel pattern at home, your normal regimen is likely to be disrupted due to multiple reasons following surgery.  Combination of anesthesia, postoperative narcotics, change in appetite and fluid intake all can affect your bowels.   YOU MUST use at least one of the following options; they are listed in order of increasing strength to get the job done.  They are all available over the counter, and you may need to use some, POSSIBLY even all of these options:    Drink plenty of fluids (prune juice may be helpful) and high fiber foods Colace 100 mg by mouth twice a day  Senokot for constipation as directed and as needed Dulcolax (bisacodyl), take with full glass of water  Miralax (polyethylene glycol) once or twice a day as needed.  If you have tried all these things and are unable to have a bowel movement in the first 3-4 days after surgery call either your  surgeon or your primary doctor.    If you experience loose stools or diarrhea, hold the medications until you stool forms back up.  If your symptoms do not get better within 1 week or if they get worse, check with your doctor.  If you experience "the worst abdominal pain ever" or develop nausea or vomiting, please contact the office immediately for further recommendations for treatment.   ITCHING:  If you experience itching with your medications, try taking only a single pain pill, or even half a pain pill at a time.  You can also use Benadryl over the counter for itching or also to help with sleep.   TED HOSE STOCKINGS:  Use stockings on both legs until for at least 2 weeks or as directed by physician office. They may be removed at night for  sleeping.  MEDICATIONS:  See your medication summary on the After Visit Summary that nursing will review with you.  You may have some home medications which will be placed on hold until you complete the course of blood thinner medication.  It is important for you to complete the blood thinner medication as prescribed.  PRECAUTIONS:  If you experience chest pain or shortness of breath - call 911 immediately for transfer to the hospital emergency department.   If you develop a fever greater that 101 F, purulent drainage from wound, increased redness or drainage from wound, foul odor from the wound/dressing, or calf pain - CONTACT YOUR SURGEON.                                                   FOLLOW-UP APPOINTMENTS:  If you do not already have a post-op appointment, please call the office for an appointment to be seen by your surgeon.  Guidelines for how soon to be seen are listed in your After Visit Summary, but are typically between 1-4 weeks after surgery.   MAKE SURE YOU:   Understand these instructions.   Get help right away if you are not doing well or get worse.    Thank you for letting us be a part of your medical care team.  It is a privilege  we respect greatly.  We hope these instructions will help you stay on track for a fast and full recovery!    Information on my medicine - XARELTO (Rivaroxaban)  This medication education was reviewed with me or my healthcare representative as part of my discharge preparation.   Why was Xarelto prescribed for you? Xarelto was prescribed for you to reduce the risk of blood clots forming after orthopedic surgery. The medical term for these abnormal blood clots is venous thromboembolism (VTE).  What do you need to know about xarelto ? Take your Xarelto ONCE DAILY at the same time every day. You may take it either with or without food.  If you have difficulty swallowing the tablet whole, you may crush it and mix in applesauce just prior to taking your dose.  Take Xarelto exactly as prescribed by your doctor and DO NOT stop taking Xarelto without talking to the doctor who prescribed the medication.  Stopping without other VTE prevention medication to take the place of Xarelto may increase your risk of developing a clot.  After discharge, you should have regular check-up appointments with your healthcare provider that is prescribing your Xarelto.    What do you do if you miss a dose? If you miss a dose, take it as soon as you remember on the same day then continue your regularly scheduled once daily regimen the next day. Do not take two doses of Xarelto on the same day.   Important Safety Information A possible side effect of Xarelto is bleeding. You should call your healthcare provider right away if you experience any of the following: ? Bleeding from an injury or your nose that does not stop. ? Unusual colored urine (red or dark brown) or unusual colored stools (red or black). ? Unusual bruising for unknown reasons. ? A serious fall or if you hit your head (even if there is no bleeding).  Some medicines may interact with Xarelto and might increase your risk of bleeding while on  Xarelto. To help avoid this, consult your healthcare provider or pharmacist prior to using any new prescription or non-prescription medications, including herbals, vitamins, non-steroidal anti-inflammatory drugs (NSAIDs) and supplements.  This website has more information on Xarelto: [email or web address]

## 2017-01-14 NOTE — Progress Notes (Addendum)
Discharge planning, spoke with patient and spouse at bedside. Have chosen [facility] for [facility]. Contacted [facility] for referral. They are unable to accept. Patient has no preference, contacted [facility] and they are able to accept with start of care 7/21. Has RW, has raised toilet seats, does not think she needs 3n1. [phone]

## 2017-01-15 NOTE — Discharge Summary (Signed)
Physician Discharge Summary   Patient ID: Nichole Pierce MRN: [id number] DOB/AGE: 70-Oct-1947 70 y.o.  Admit date: 01/13/2017 Discharge date: 01/14/2017  Primary Diagnosis: Primary osteoarthritis left hip   Admission Diagnoses:  Past Medical History:  Diagnosis Date  . Arthritis   . Cancer [facility])    uterine cancer ; hysterectomy performed   . Hypertension   . Thyroid disease    Discharge Diagnoses:   Active Problems:   OA (osteoarthritis) of hip  Estimated body mass index is 26.29 kg/m as calculated from the following:   Height as of this encounter: 5' 5"  (1.651 m).   Weight as of this encounter: 71.7 kg (158 lb).  Procedure(s) (LRB): LEFT TOTAL HIP ARTHROPLASTY ANTERIOR APPROACH (Left)   Consults: None  HPI: The patient is a 71 year old female who comes in s/p out from right total hip arthroplasty. The patient states that she is doing very well at this time. The pain is under excellent control at this time and describes their pain as none. They are currently on no medication for their pain. The patient is currently doing home exercise program. she has gone back to ballroom dancing. The patient feels that they are progressing well at this time. She would like to get scheduled for left THA. She said her right hip is doing fantastic at this time. She is really pleased with it. The only thing holding her back now is her left hip. She does have documented bone on bone arthritis on the left. She feels like she is ready to proceed with the other side. Left hip is very symptomatic and she wants to get that fixed. She does have bone-on-bone arthritis with significant pain and dysfunction, so I do believe that total hip arthroplasty would be the best means for getting her better. They have been treated conservatively in the past for the above stated problem and despite conservative measures, they continue to have progressive pain and severe functional limitations and dysfunction. They have  failed non-operative management including home exercise, medications, and exercises. It is felt that they would benefit from undergoing total joint replacement. Risks and benefits of the procedure have been discussed with the patient and they elect to proceed with surgery. There are no active contraindications to surgery such as ongoing infection or rapidly progressive neurological disease.  Laboratory Data: Admission on 01/13/2017, Discharged on 01/14/2017  Component Date Value Ref Range Status  . WBC 01/14/2017 18.2* 4.0 - 10.5 K/uL Final  . RBC 01/14/2017 3.95  3.87 - 5.11 MIL/uL Final  . Hemoglobin 01/14/2017 11.6* 12.0 - 15.0 g/dL Final  . HCT 01/14/2017 34.9* 36.0 - 46.0 % Final  . MCV 01/14/2017 88.4  78.0 - 100.0 fL Final  . MCH 01/14/2017 29.4  26.0 - 34.0 pg Final  . MCHC 01/14/2017 33.2  30.0 - 36.0 g/dL Final  . RDW 01/14/2017 14.3  11.5 - 15.5 % Final  . Platelets 01/14/2017 226  150 - 400 K/uL Final  . Sodium 01/14/2017 137  135 - 145 mmol/L Final  . Potassium 01/14/2017 4.0  3.5 - 5.1 mmol/L Final  . Chloride 01/14/2017 103  101 - 111 mmol/L Final  . CO2 01/14/2017 26  22 - 32 mmol/L Final  . Glucose, Bld 01/14/2017 143* 65 - 99 mg/dL Final  . BUN 01/14/2017 15  6 - 20 mg/dL Final  . Creatinine, Ser 01/14/2017 0.74  0.44 - 1.00 mg/dL Final  . Calcium 01/14/2017 8.3* 8.9 - 10.3 mg/dL Final  .  GFR calc non Af Amer 01/14/2017 >60  >60 mL/min Final  . GFR calc Af Amer 01/14/2017 >60  >60 mL/min Final   Comment: (NOTE) The eGFR has been calculated using the CKD EPI equation. This calculation has not been validated in all clinical situations. eGFR's persistently <60 mL/min signify possible Chronic Kidney Disease.   Georgiann Hahn gap 01/14/2017 8  5 - 15 Final  Hospital Outpatient Visit on 01/06/2017  Component Date Value Ref Range Status  . aPTT 01/06/2017 40* 24 - 36 seconds Final   Comment:        IF BASELINE aPTT IS ELEVATED, SUGGEST PATIENT RISK ASSESSMENT BE USED TO  DETERMINE APPROPRIATE ANTICOAGULANT THERAPY.   . WBC 01/06/2017 8.0  4.0 - 10.5 K/uL Final  . RBC 01/06/2017 4.69  3.87 - 5.11 MIL/uL Final  . Hemoglobin 01/06/2017 13.9  12.0 - 15.0 g/dL Final  . HCT 01/06/2017 41.1  36.0 - 46.0 % Final  . MCV 01/06/2017 87.6  78.0 - 100.0 fL Final  . MCH 01/06/2017 29.6  26.0 - 34.0 pg Final  . MCHC 01/06/2017 33.8  30.0 - 36.0 g/dL Final  . RDW 01/06/2017 14.4  11.5 - 15.5 % Final  . Platelets 01/06/2017 238  150 - 400 K/uL Final  . Sodium 01/06/2017 139  135 - 145 mmol/L Final  . Potassium 01/06/2017 4.3  3.5 - 5.1 mmol/L Final  . Chloride 01/06/2017 104  101 - 111 mmol/L Final  . CO2 01/06/2017 27  22 - 32 mmol/L Final  . Glucose, Bld 01/06/2017 87  65 - 99 mg/dL Final  . BUN 01/06/2017 21* 6 - 20 mg/dL Final  . Creatinine, Ser 01/06/2017 0.65  0.44 - 1.00 mg/dL Final  . Calcium 01/06/2017 9.9  8.9 - 10.3 mg/dL Final  . Total Protein 01/06/2017 6.7  6.5 - 8.1 g/dL Final  . Albumin 01/06/2017 4.1  3.5 - 5.0 g/dL Final  . AST 01/06/2017 18  15 - 41 U/L Final  . ALT 01/06/2017 12* 14 - 54 U/L Final  . Alkaline Phosphatase 01/06/2017 52  38 - 126 U/L Final  . Total Bilirubin 01/06/2017 0.4  0.3 - 1.2 mg/dL Final  . GFR calc non Af Amer 01/06/2017 >60  >60 mL/min Final  . GFR calc Af Amer 01/06/2017 >60  >60 mL/min Final   Comment: (NOTE) The eGFR has been calculated using the CKD EPI equation. This calculation has not been validated in all clinical situations. eGFR's persistently <60 mL/min signify possible Chronic Kidney Disease.   . Anion gap 01/06/2017 8  5 - 15 Final  . Prothrombin Time 01/06/2017 13.1  11.4 - 15.2 seconds Final  . INR 01/06/2017 0.99   Final  . ABO/RH(D) 01/06/2017 O POS   Final  . Antibody Screen 01/06/2017 NEG   Final  . Sample Expiration 01/06/2017 01/16/2017   Final  . Extend sample reason 01/06/2017 NO TRANSFUSIONS OR PREGNANCY IN THE PAST 3 MONTHS   Final  . MRSA, PCR 01/06/2017 NEGATIVE  NEGATIVE Final  .  Staphylococcus aureus 01/06/2017 NEGATIVE  NEGATIVE Final   Comment:        The Xpert SA Assay (FDA approved for NASAL specimens in patients over 39 years of age), is one component of a comprehensive surveillance program.  Test performance has been validated by [facility] for patients greater than or equal to 52 year old. It is not intended to diagnose infection nor to guide or monitor treatment.  X-Rays:Dg Pelvis Portable  Result Date: 01/13/2017 CLINICAL DATA:  Left total hip prosthesis insertion. EXAM: PORTABLE PELVIS 1-2 VIEWS; DG C-ARM 1-60 MIN-NO REPORT COMPARISON:  None FINDINGS: The patient has undergone left total hip prosthesis insertion. The acetabular and femoral components appear in good position in the AP projection. No fractures. Soft tissue drain in place. Note is made of right total hip prosthesis. IMPRESSION: New left total hip prosthesis in good position in the AP projection. FLUOROSCOPY TIME:  12.6 seconds C-arm fluoroscopic images were obtained intraoperatively and submitted for post operative interpretation. Electronically Signed   By: Lorriane Shire M.D.   On: 01/13/2017 13:51   Dg C-arm 1-60 Min-no Report  Result Date: 01/13/2017 Fluoroscopy was utilized by the requesting physician.  No radiographic interpretation.    EKG: Orders placed or performed during the hospital encounter of 05/07/16  . EKG 12 lead  . EKG 12 lead     Hospital Course: Patient was admitted to [facility] and taken to the OR and underwent the above state procedure without complications.  Patient tolerated the procedure well and was later transferred to the recovery room and then to the orthopaedic floor for postoperative care.  They were given PO and IV analgesics for pain control following their surgery.  They were given 24 hours of postoperative antibiotics of  Anti-infectives    Start     Dose/Rate Route Frequency Ordered Stop   01/13/17 1800  ceFAZolin (ANCEF) IVPB  2g/100 mL premix     2 g 200 mL/hr over 30 Minutes Intravenous Every 6 hours 01/13/17 1537 01/13/17 2348   01/13/17 0848  ceFAZolin (ANCEF) 2-4 GM/100ML-% IVPB    Comments:  Waldron Session   : cabinet override      01/13/17 0848 01/13/17 1044   01/13/17 0837  ceFAZolin (ANCEF) IVPB 2g/100 mL premix     2 g 200 mL/hr over 30 Minutes Intravenous On call to O.R. 01/13/17 0973 01/13/17 1114     and started on DVT prophylaxis in the form of Xarelto.   PT and OT were ordered for total hip protocol.  The patient was allowed to be WBAT with therapy. Discharge planning was consulted to help with postop disposition and equipment needs.  Patient had a good night on the evening of surgery.  They started to get up OOB with therapy on day one.  Hemovac drain was pulled without difficulty.  The patient had progressed with therapy and meeting their goals.  Incision was healing well.  Patient was seen in rounds and was ready to go home.   Diet: Cardiac diet Activity:WBAT No bending hip over 90 degrees- A "L" Angle Do not cross legs Do not let foot roll inward When turning these patients a pillow should be placed between the patient's legs to prevent crossing. Patients should have the affected knee fully extended when trying to sit or stand from all surfaces to prevent excessive hip flexion. When ambulating and turning toward the affected side the affected leg should have the toes turned out prior to moving the walker and the rest of patient's body as to prevent internal rotation/ turning in of the leg. Abduction pillows are the most effective way to prevent a patient from not crossing legs or turning toes in at rest. If an abduction pillow is not ordered placing a regular pillow length wise between the patient's legs is also an effective reminder. It is imperative that these precautions be maintained so that the surgical hip does  not dislocate. Follow-up:in 2 weeks Disposition - Home Discharged Condition:  stable   Discharge Instructions    Call MD / Call 911    Complete by:  As directed    If you experience chest pain or shortness of breath, CALL 911 and be transported to the hospital emergency room.  If you develope a fever above 101 F, pus (white drainage) or increased drainage or redness at the wound, or calf pain, call your surgeon's office.   Constipation Prevention    Complete by:  As directed    Drink plenty of fluids.  Prune juice may be helpful.  You may use a stool softener, such as Colace (over the counter) 100 mg twice a day.  Use MiraLax (over the counter) for constipation as needed.   Diet - low sodium heart healthy    Complete by:  As directed    Discharge instructions    Complete by:  As directed    INSTRUCTIONS AFTER JOINT REPLACEMENT   Remove items at home which could result in a fall. This includes throw rugs or furniture in walking pathways ICE to the affected joint every three hours while awake for 30 minutes at a time, for at least the first 3-5 days, and then as needed for pain and swelling.  Continue to use ice for pain and swelling. You may notice swelling that will progress down to the foot and ankle.  This is normal after surgery.  Elevate your leg when you are not up walking on it.   Continue to use the breathing machine you got in the hospital (incentive spirometer) which will help keep your temperature down.  It is common for your temperature to cycle up and down following surgery, especially at night when you are not up moving around and exerting yourself.  The breathing machine keeps your lungs expanded and your temperature down.   DIET:  As you were doing prior to hospitalization, we recommend a well-balanced diet.  DRESSING / WOUND CARE / SHOWERING  You may change your dressing every day with sterile gauze.  Please use good hand washing techniques before changing the dressing.  Do not use any lotions or creams on the incision until instructed by your  surgeon.  ACTIVITY  Increase activity slowly as tolerated, but follow the weight bearing instructions below.   No driving for 6 weeks or until further direction given by your physician.  You cannot drive while taking narcotics.  No lifting or carrying greater than 10 lbs. until further directed by your surgeon. Avoid periods of inactivity such as sitting longer than an hour when not asleep. This helps prevent blood clots.  You may return to work once you are authorized by your doctor.     WEIGHT BEARING   Weight bearing as tolerated with assist device (walker, cane, etc) as directed, use it as long as suggested by your surgeon or therapist, typically at least 4-6 weeks.   EXERCISES  Results after joint replacement surgery are often greatly improved when you follow the exercise, range of motion and muscle strengthening exercises prescribed by your doctor. Safety measures are also important to protect the joint from further injury. Any time any of these exercises cause you to have increased pain or swelling, decrease what you are doing until you are comfortable again and then slowly increase them. If you have problems or questions, call your caregiver or physical therapist for advice.   Rehabilitation is important following a joint replacement. After just  a few days of immobilization, the muscles of the leg can become weakened and shrink (atrophy).  These exercises are designed to build up the tone and strength of the thigh and leg muscles and to improve motion. Often times heat used for twenty to thirty minutes before working out will loosen up your tissues and help with improving the range of motion but do not use heat for the first two weeks following surgery (sometimes heat can increase post-operative swelling).   These exercises can be done on a training (exercise) mat, on the floor, on a table or on a bed. Use whatever works the best and is most comfortable for you.    Use music or  television while you are exercising so that the exercises are a pleasant break in your day. This will make your life better with the exercises acting as a break in your routine that you can look forward to.   Perform all exercises about fifteen times, three times per day or as directed.  You should exercise both the operative leg and the other leg as well.  Exercises include:   Quad Sets - Tighten up the muscle on the front of the thigh (Quad) and hold for 5-10 seconds.   Straight Leg Raises - With your knee straight (if you were given a brace, keep it on), lift the leg to 60 degrees, hold for 3 seconds, and slowly lower the leg.  Perform this exercise against resistance later as your leg gets stronger.  Leg Slides: Lying on your back, slowly slide your foot toward your buttocks, bending your knee up off the floor (only go as far as is comfortable). Then slowly slide your foot back down until your leg is flat on the floor again.  Angel Wings: Lying on your back spread your legs to the side as far apart as you can without causing discomfort.  Hamstring Strength:  Lying on your back, push your heel against the floor with your leg straight by tightening up the muscles of your buttocks.  Repeat, but this time bend your knee to a comfortable angle, and push your heel against the floor.  You may put a pillow under the heel to make it more comfortable if necessary.   A rehabilitation program following joint replacement surgery can speed recovery and prevent re-injury in the future due to weakened muscles. Contact your doctor or a physical therapist for more information on knee rehabilitation.    CONSTIPATION  Constipation is defined medically as fewer than three stools per week and severe constipation as less than one stool per week.  Even if you have a regular bowel pattern at home, your normal regimen is likely to be disrupted due to multiple reasons following surgery.  Combination of anesthesia,  postoperative narcotics, change in appetite and fluid intake all can affect your bowels.   YOU MUST use at least one of the following options; they are listed in order of increasing strength to get the job done.  They are all available over the counter, and you may need to use some, POSSIBLY even all of these options:    Drink plenty of fluids (prune juice may be helpful) and high fiber foods Colace 100 mg by mouth twice a day  Senokot for constipation as directed and as needed Dulcolax (bisacodyl), take with full glass of water  Miralax (polyethylene glycol) once or twice a day as needed.  If you have tried all these things and are unable to have  a bowel movement in the first 3-4 days after surgery call either your surgeon or your primary doctor.    If you experience loose stools or diarrhea, hold the medications until you stool forms back up.  If your symptoms do not get better within 1 week or if they get worse, check with your doctor.  If you experience "the worst abdominal pain ever" or develop nausea or vomiting, please contact the office immediately for further recommendations for treatment.   ITCHING:  If you experience itching with your medications, try taking only a single pain pill, or even half a pain pill at a time.  You can also use Benadryl over the counter for itching or also to help with sleep.   TED HOSE STOCKINGS:  Use stockings on both legs until for at least 2 weeks or as directed by physician office. They may be removed at night for sleeping.  MEDICATIONS:  See your medication summary on the "After Visit Summary" that nursing will review with you.  You may have some home medications which will be placed on hold until you complete the course of blood thinner medication.  It is important for you to complete the blood thinner medication as prescribed.  PRECAUTIONS:  If you experience chest pain or shortness of breath - call 911 immediately for transfer to the hospital emergency  department.   If you develop a fever greater that 101 F, purulent drainage from wound, increased redness or drainage from wound, foul odor from the wound/dressing, or calf pain - CONTACT YOUR SURGEON.                                                   FOLLOW-UP APPOINTMENTS:  If you do not already have a post-op appointment, please call the office for an appointment to be seen by your surgeon.  Guidelines for how soon to be seen are listed in your "After Visit Summary", but are typically between 1-4 weeks after surgery.   MAKE SURE YOU:  Understand these instructions.  Get help right away if you are not doing well or get worse.    Thank you for letting us be a part of your medical care team.  It is a privilege we respect greatly.  We hope these instructions will help you stay on track for a fast and full recovery!    Information on my medicine - XARELTO (Rivaroxaban)  This medication education was reviewed with me or my healthcare representative as part of my discharge preparation.   Why was Xarelto prescribed for you? Xarelto was prescribed for you to reduce the risk of blood clots forming after orthopedic surgery. The medical term for these abnormal blood clots is venous thromboembolism (VTE).  What do you need to know about xarelto ? Take your Xarelto ONCE DAILY at the same time every day. You may take it either with or without food.  If you have difficulty swallowing the tablet whole, you may crush it and mix in applesauce just prior to taking your dose.  Take Xarelto exactly as prescribed by your doctor and DO NOT stop taking Xarelto without talking to the doctor who prescribed the medication.  Stopping without other VTE prevention medication to take the place of Xarelto may increase your risk of developing a clot.  After discharge, you should have regular check-up appointments  with your healthcare provider that is prescribing your Xarelto.    What do you do if you miss a  dose? If you miss a dose, take it as soon as you remember on the same day then continue your regularly scheduled once daily regimen the next day. Do not take two doses of Xarelto on the same day.   Important Safety Information A possible side effect of Xarelto is bleeding. You should call your healthcare provider right away if you experience any of the following: Bleeding from an injury or your nose that does not stop. Unusual colored urine (red or dark brown) or unusual colored stools (red or black). Unusual bruising for unknown reasons. A serious fall or if you hit your head (even if there is no bleeding).  Some medicines may interact with Xarelto and might increase your risk of bleeding while on Xarelto. To help avoid this, consult your healthcare provider or pharmacist prior to using any new prescription or non-prescription medications, including herbals, vitamins, non-steroidal anti-inflammatory drugs (NSAIDs) and supplements.  This website has more information on Xarelto: [email or web address]   Increase activity slowly as tolerated    Complete by:  As directed      Allergies as of 01/14/2017      Reactions   Erythromycin Other (See Comments), Cough   Stuffy nose   Lisinopril Cough   Tape Other (See Comments)   PAPER TAPE ONLY SURGICAL TAPE CAUSES BLISTERS      Medication List    STOP taking these medications   aspirin EC 81 MG tablet   cholecalciferol 1000 units tablet Commonly known as:  VITAMIN D   CoQ-10 100 MG Caps   ibuprofen 200 MG tablet Commonly known as:  ADVIL,MOTRIN   KRILL OIL PO   multivitamin with minerals Tabs tablet   THERATEARS OP     TAKE these medications   denosumab 60 MG/ML Soln injection Commonly known as:  PROLIA Inject 60 mg into the skin every 6 (six) months. Administer in upper arm, thigh, or abdomen   estradiol 0.1 MG/24HR patch Commonly known as:  VIVELLE-DOT Place 1 patch onto the skin 2 (two) times a week. Changes Saturday  night and Wednesday mornings   HYDROmorphone 2 MG tablet Commonly known as:  DILAUDID Take 1-2 tablets (2-4 mg total) by mouth every 4 (four) hours as needed for severe pain.   levothyroxine 75 MCG tablet Commonly known as:  SYNTHROID, LEVOTHROID Take 75 mcg by mouth daily before breakfast.   losartan 50 MG tablet Commonly known as:  COZAAR Take 50 mg by mouth daily.   rivaroxaban 10 MG Tabs tablet Commonly known as:  XARELTO Take 1 tablet (10 mg total) by mouth daily with breakfast.   simvastatin 20 MG tablet Commonly known as:  ZOCOR Take 20 mg by mouth every evening.   tiZANidine 4 MG tablet Commonly known as:  ZANAFLEX Take 1 tablet (4 mg total) by mouth every 8 (eight) hours as needed for muscle spasms.   traMADol 50 MG tablet Commonly known as:  ULTRAM Take 1-2 tablets (50-100 mg total) by mouth every 6 (six) hours as needed for moderate pain.      Follow-up Information    Gaynelle Arabian, MD. Schedule an appointment as soon as possible for a visit on 01/26/2017.   Specialty:  Orthopedic Surgery Contact information: [address] [place] [address] [phone]        [facility] Follow up.   Why:  Physical therapy Contact information: [phone]          Signed: Ardeen Jourdain, PA-C Orthopaedic Surgery 01/15/2017, 8:42 AM

## 2018-01-23 IMAGING — DX DG PORTABLE PELVIS
2 series · 2 of 2 positions shown · non-contrast
Comparison: None

CLINICAL DATA: Left total hip prosthesis insertion.

EXAM:
PORTABLE PELVIS 1-2 VIEWS; DG C-ARM 1-60 MIN-NO REPORT

[pelvis ap (1 of 2)]
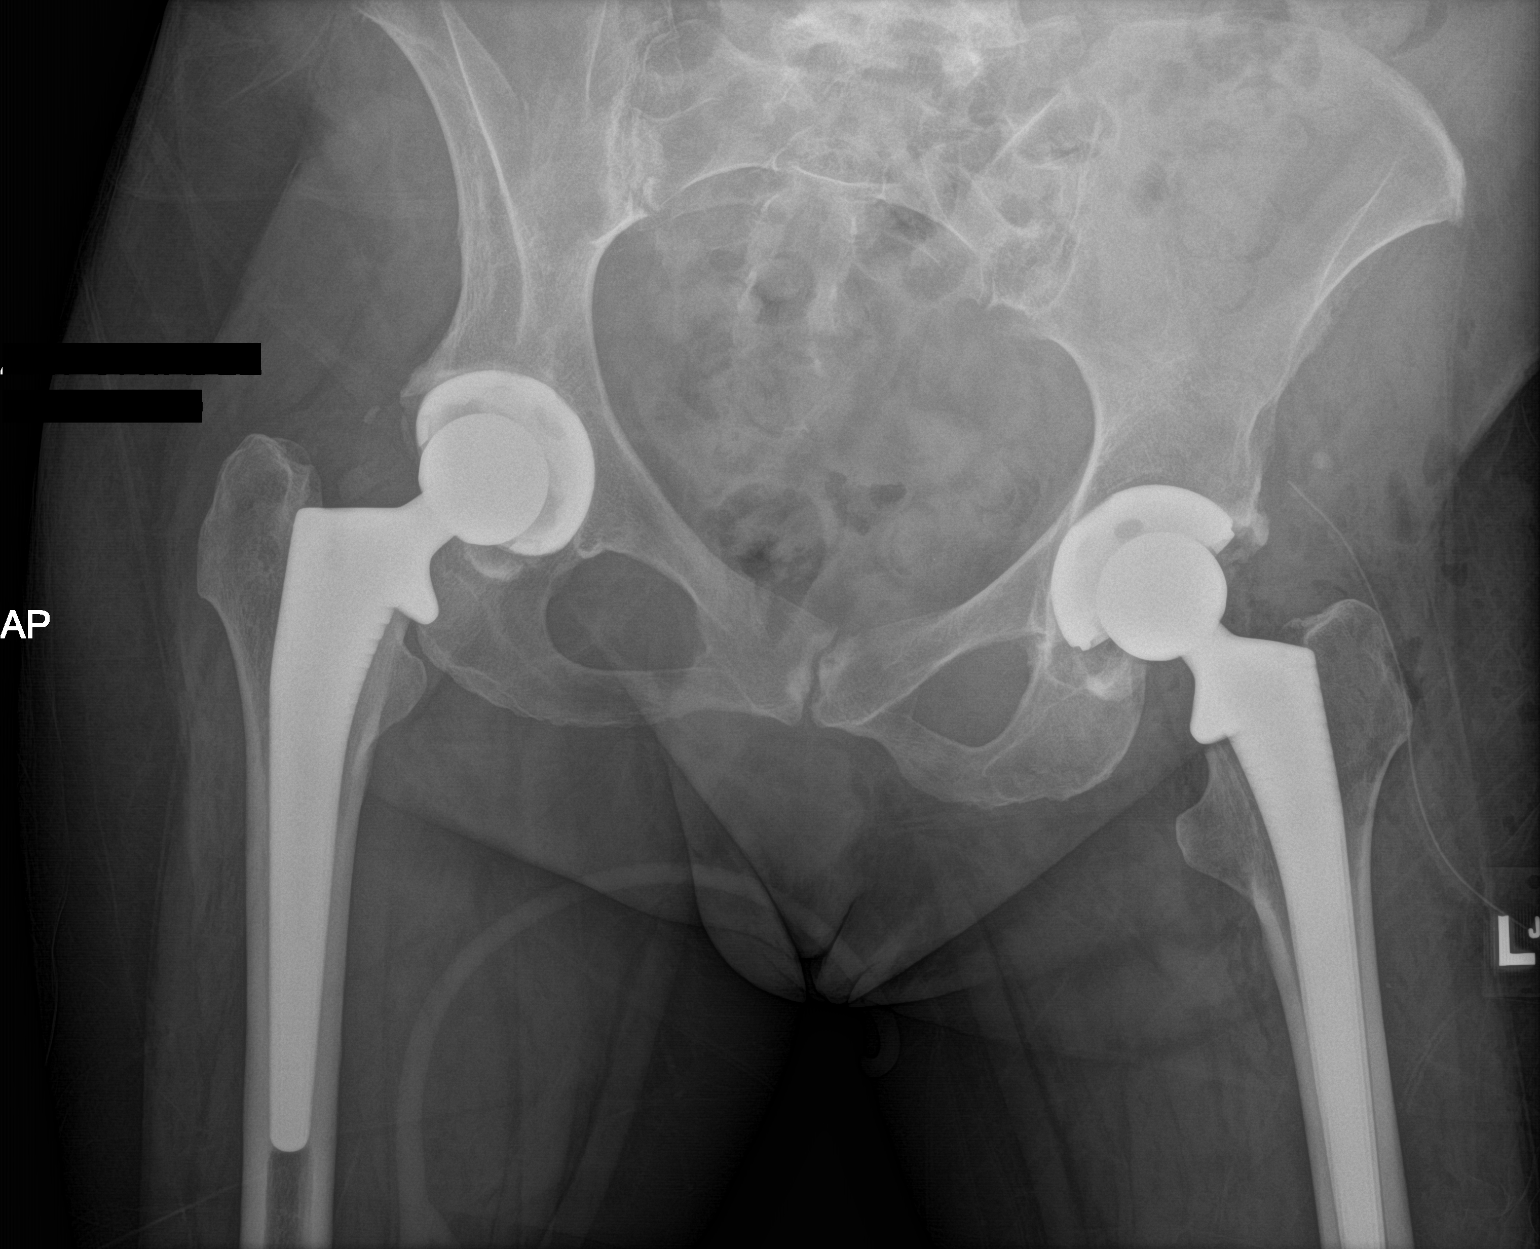

[pelvis ap (2 of 2)]
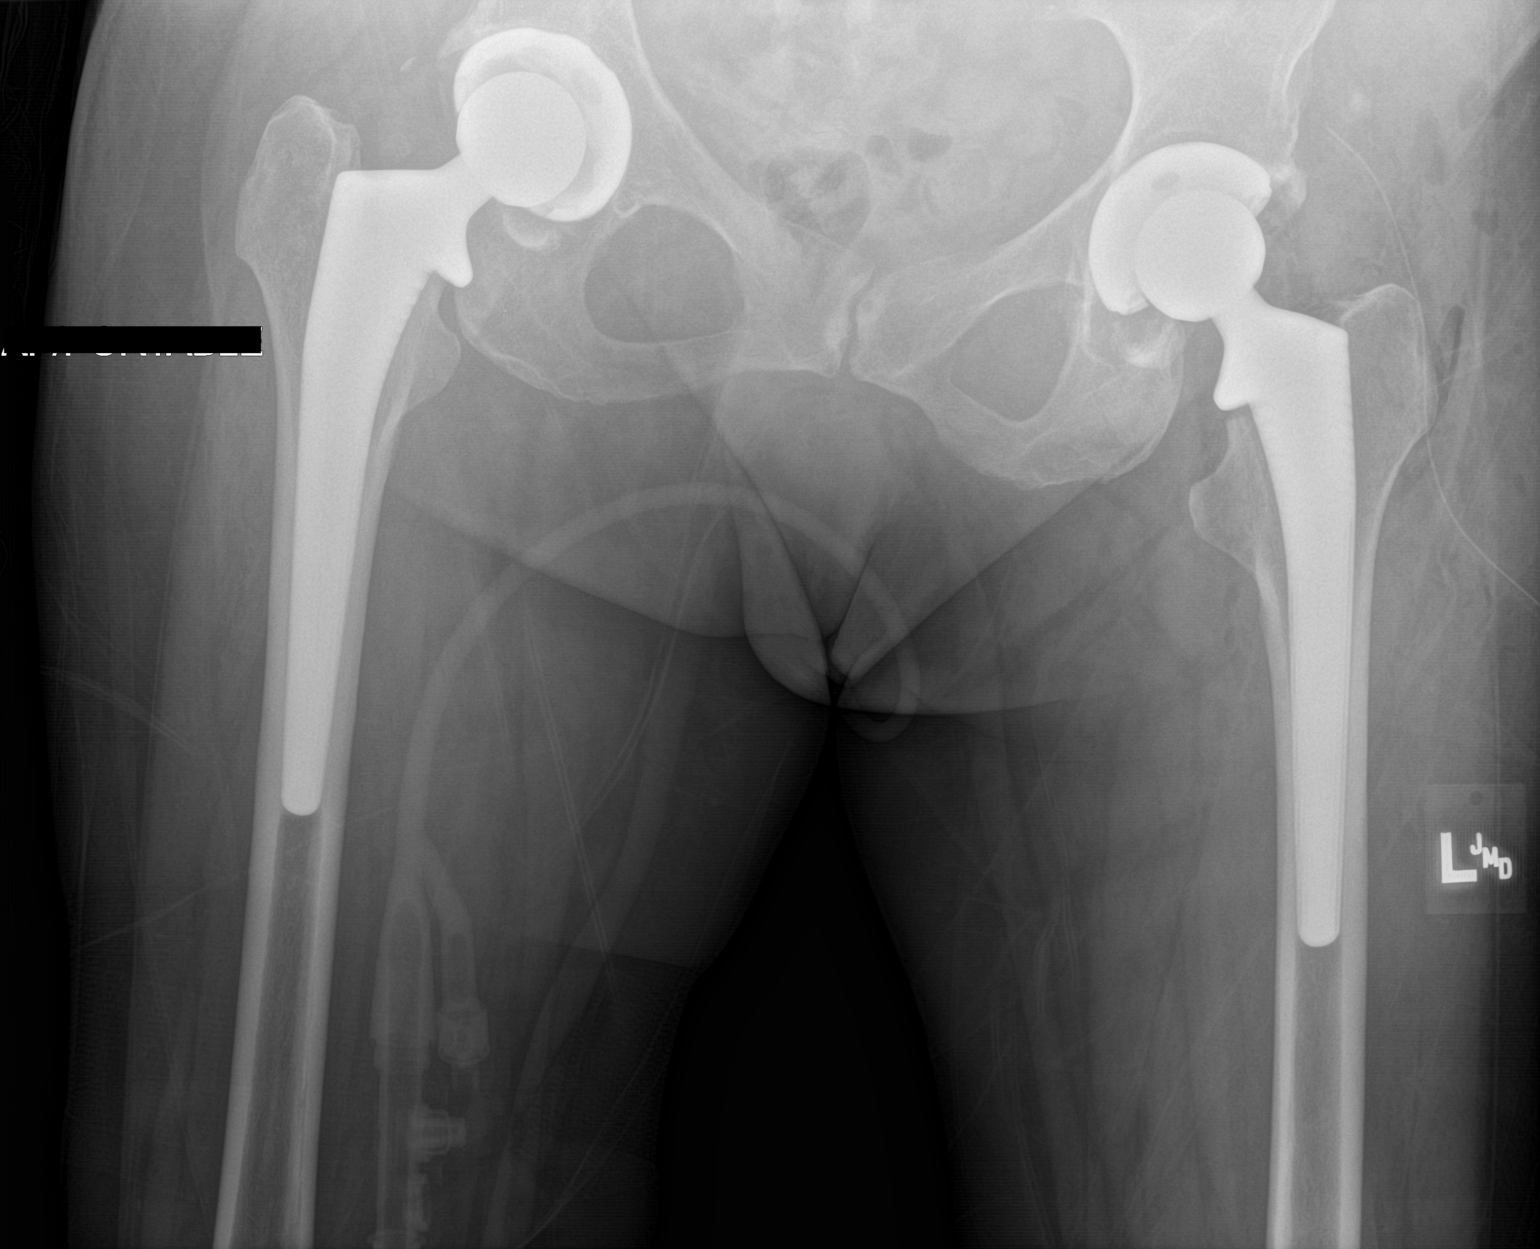

[2 of 2 positions shown; findings below may reference images not displayed]

FINDINGS: The patient has undergone left total hip prosthesis insertion. The
acetabular and femoral components appear in good position in the AP
projection. No fractures. Soft tissue drain in place.

Note is made of right total hip prosthesis.
IMPRESSION: New left total hip prosthesis in good position in the AP projection.

FLUOROSCOPY TIME:  12.6 seconds

C-arm fluoroscopic images were obtained intraoperatively and
submitted for post operative interpretation.
